# Patient Record
Sex: Male | Born: 2000
Health system: Southern US, Community
[De-identification: ages and names within clinical notes are randomized; demographics above are authoritative.]

## PROBLEM LIST (undated history)

## (undated) DIAGNOSIS — K5190 Ulcerative colitis, unspecified, without complications: Secondary | ICD-10-CM

## (undated) MED FILL — Medication: Fill #1 | Status: CN

---
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
## 2020-01-01 DIAGNOSIS — Z114 Encounter for screening for human immunodeficiency virus [HIV]: Secondary | ICD-10-CM | POA: Diagnosis not present

## 2020-01-01 DIAGNOSIS — Z113 Encounter for screening for infections with a predominantly sexual mode of transmission: Secondary | ICD-10-CM | POA: Diagnosis not present

## 2020-01-11 MED FILL — HUMIRA PEN 40 MG/0.8ML PNKT: 40 | 28 days supply | Qty: 2 | Fill #2

## 2020-02-09 ENCOUNTER — Encounter (INDEPENDENT_AMBULATORY_CARE_PROVIDER_SITE_OTHER): Payer: Self-pay | Admitting: Student in an Organized Health Care Education/Training Program

## 2020-02-11 ENCOUNTER — Other Ambulatory Visit (HOSPITAL_COMMUNITY): Payer: Self-pay | Admitting: Internal Medicine

## 2020-02-11 ENCOUNTER — Other Ambulatory Visit: Payer: Self-pay | Admitting: Pharmacist

## 2020-02-11 MED ORDER — HUMIRA PEN 40 MG/0.8ML ~~LOC~~ PNKT: PEN_INJECTOR | SUBCUTANEOUS | 2 refills | Status: DC

## 2020-02-12 MED FILL — HUMIRA PEN 40 MG/0.8ML PNKT: 40 | 28 days supply | Qty: 2 | Fill #0

## 2020-02-16 DIAGNOSIS — K5100 Ulcerative (chronic) pancolitis without complications: Secondary | ICD-10-CM | POA: Diagnosis not present

## 2020-02-29 ENCOUNTER — Telehealth (INDEPENDENT_AMBULATORY_CARE_PROVIDER_SITE_OTHER): Payer: Self-pay

## 2020-02-29 ENCOUNTER — Telehealth (INDEPENDENT_AMBULATORY_CARE_PROVIDER_SITE_OTHER): Payer: 59 | Admitting: Pediatric Gastroenterology

## 2020-02-29 ENCOUNTER — Encounter (INDEPENDENT_AMBULATORY_CARE_PROVIDER_SITE_OTHER): Payer: Self-pay

## 2020-02-29 NOTE — Telephone Encounter (Signed)
Called in regards to video visit. Patient had not logged on by scheduled time. Went straight to [employer]. Left message to call the office back.

## 2020-02-29 NOTE — Progress Notes (Deleted)
This is a Pediatric Specialist E-Visit follow up consult provided via [place] and their parent/guardian Theodore Hopkins (name of consenting adult) consented to an E-Visit consult today.  Location of patient: [name] is at his home (location) Location of provider: Harold Hopkins is at his office (location) Patient was referred by Theodore Fam, MD   The following participants were involved in this E-Visit: The patient, his mother and me (list of participants and their roles)  Chief Complain/ Reason for E-Visit today: Ulcerative colitis follow-up Total time on call: 30 Follow up: 1 month     Pediatric Gastroenterology Follow Up Visit   REFERRING PROVIDER:  Monna Hopkins, [address] [place],  [place] [id number]   ASSESSMENT:     I had the pleasure of seeing Theodore Hopkins, 19 y.o. 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 Since his last visit, we changed his treatment to adalimumab (Humira) due to active disease. An upper endoscopy in June 2021 was grossly normal. Colonoscopy showed moderate inflammation in the left colon when he was on mesalamine.  Quantiferon Gold was negative in June 2021; HBsAb was non-reactive  Theodore Hopkins avoiding lactose-containing products, or he takes them with Lactaid supplements to decrease his symptoms of lactose intolerance.   Nutrition is good.  I have no concerns about mental health at this time.  The goal of therapy is to obtain mucosal healing to try to decrease the risk of long-term complications such as colorectal cancer.      PLAN:       *** Thank you for allowing [place] to participate in the care of your patient      HISTORY OF PRESENT ILLNESS: Theodore Hopkins is a 19 y.o. male (DOB: 08-10-2000) who is seen in follow up for evaluation of ulcerative colitis, treated with Humira. History was obtained from both Theodore Hopkins and his mother. Overall, *** is doing ***.  Stools are *** per day. The stools are *** consistency. There is *** in the stool. There is *** abdominal pain. There is *** vomiting. There is *** nausea. Energy level is ***. Appetite is ***. Weight is ***. ***  has no signs of extraintestinal manifestations of active IBD, including dysphagia, fever, arthralgia, arthritis, back pain, jaundice, pruritus, erythema nodosum, eye redness, eye pain, shortness of breath, or oral ulceration. Last menstrual period was ***.   PAST MEDICAL HISTORY: Past Medical History:  Diagnosis Date  . Ulcerative colitis ([place])    Immunization History  Administered Date(s) Administered  . Influenza,inj,Quad PF,6+ Mos 12/20/2013, 12/24/2014, 01/19/2017, 01/22/2018  . Influenza-Unspecified 12/04/2015   PAST SURGICAL HISTORY: No past surgical history on file. SOCIAL HISTORY: Social History   Socioeconomic History  . Marital status: Single    Spouse name: Not on file  . Number of children: Not on file  . Years of education: Not on file  . Highest education level: Not on file  Occupational History  . Not on file  Tobacco Use  . Smoking status: Never Smoker  . Smokeless tobacco: Never Used  Substance and Sexual Activity  . Alcohol use: Not on file  . Drug use: Not on file  . Sexual activity: Not on file  Other Topics Concern  . Not on file  Social History Narrative   12th grade at [employer] with mom, dad   Social Determinants of Health   Financial Resource Strain: Not on file  Food Insecurity: Not  on file  Transportation Needs: Not on file  Physical Activity: Not on file  Stress: Not on file  Social Connections: Not on file   FAMILY HISTORY: family history includes Diabetes in his father and paternal grandfather.   REVIEW OF SYSTEMS:  The balance of 12 systems reviewed is negative except as noted in the HPI.  MEDICATIONS: Current Outpatient Medications  Medication Sig Dispense Refill  . Adalimumab (HUMIRA PEN) 40 MG/0.8ML  PNKT Inject 0.8 ml (40 mg total) under the skin every 14 days. 2 each 2  . FLUCELVAX QUADRIVALENT 0.5 ML injection      No current facility-administered medications for this visit.   ALLERGIES: Cashew nut oil, Other, and Apriso [mesalamine er]  VITAL SIGNS: There were no vitals taken for this visit. PHYSICAL EXAM: Looked well on video exam  DIAGNOSTIC STUDIES:  I have reviewed all pertinent diagnostic studies, including:  No results found for this or any previous visit (from the past 2160 hour(s)).   Samyukta Cura A. Theodore Savannah, MD Chief, Division of Pediatric Gastroenterology Professor of Pediatrics

## 2020-03-01 ENCOUNTER — Ambulatory Visit: Payer: 59 | Attending: Internal Medicine

## 2020-03-01 ENCOUNTER — Other Ambulatory Visit (HOSPITAL_BASED_OUTPATIENT_CLINIC_OR_DEPARTMENT_OTHER): Payer: Self-pay | Admitting: Internal Medicine

## 2020-03-01 DIAGNOSIS — Z23 Encounter for immunization: Secondary | ICD-10-CM

## 2020-03-01 NOTE — Progress Notes (Signed)
   Covid-19 Vaccination Clinic  Name:  Theodore Hopkins    MRN: [id number] DOB: [birth year]  03/01/2020  Mr. Flenner was observed post Covid-19 immunization for 15 minutes without incident. He was provided with Vaccine Information Sheet and instruction to access the V-Safe system.  Vaccinated by [facility] Brunette  Mr. Robbins was instructed to call 911 with any severe reactions post vaccine: Marland Kitchen Difficulty breathing  . Swelling of face and throat  . A fast heartbeat  . A bad rash all over body  . Dizziness and weakness   Immunizations Administered    Name Date Dose VIS Date Route   Moderna Covid-19 Booster Vaccine 03/01/2020  2:30 PM 0.25 mL 12/23/2019 Intramuscular   Manufacturer: Levan Hurst   Lot: [id number]   [place]: [id number]

## 2020-03-02 MED FILL — PFIZER-BIONTECH COVID-19 VA: 30 | 21 days supply | Qty: 0 | Fill #0

## 2020-03-08 DIAGNOSIS — H5213 Myopia, bilateral: Secondary | ICD-10-CM | POA: Diagnosis not present

## 2020-03-30 DIAGNOSIS — K5130 Ulcerative (chronic) rectosigmoiditis without complications: Secondary | ICD-10-CM | POA: Diagnosis not present

## 2020-04-19 ENCOUNTER — Other Ambulatory Visit (HOSPITAL_COMMUNITY): Payer: Self-pay | Admitting: Pediatric Gastroenterology

## 2020-04-20 ENCOUNTER — Other Ambulatory Visit: Payer: Self-pay | Admitting: Pharmacist

## 2020-04-20 ENCOUNTER — Other Ambulatory Visit (HOSPITAL_COMMUNITY): Payer: Self-pay | Admitting: Internal Medicine

## 2020-04-20 MED ORDER — HUMIRA PEN 40 MG/0.8ML ~~LOC~~ PNKT: PEN_INJECTOR | SUBCUTANEOUS | 2 refills | Status: DC

## 2020-04-20 MED ORDER — HUMIRA PEN 40 MG/0.8ML ~~LOC~~ PNKT: PEN_INJECTOR | SUBCUTANEOUS | 3 refills | Status: DC

## 2020-04-20 MED FILL — HUMIRA PEN 40 MG/0.8ML PNKT: 40 | 28 days supply | Qty: 2 | Fill #0

## 2020-05-27 ENCOUNTER — Other Ambulatory Visit (HOSPITAL_COMMUNITY): Payer: Self-pay

## 2020-06-09 ENCOUNTER — Other Ambulatory Visit (HOSPITAL_COMMUNITY): Payer: Self-pay

## 2020-06-09 MED FILL — Adalimumab Auto-injector Kit 40 MG/0.8ML: SUBCUTANEOUS | 28 days supply | Qty: 2 | Fill #0 | Status: AC

## 2020-06-27 ENCOUNTER — Other Ambulatory Visit (HOSPITAL_COMMUNITY): Payer: Self-pay

## 2020-06-29 ENCOUNTER — Other Ambulatory Visit (HOSPITAL_COMMUNITY): Payer: Self-pay

## 2020-07-14 ENCOUNTER — Encounter (HOSPITAL_BASED_OUTPATIENT_CLINIC_OR_DEPARTMENT_OTHER): Payer: Self-pay

## 2020-07-14 ENCOUNTER — Emergency Department (HOSPITAL_BASED_OUTPATIENT_CLINIC_OR_DEPARTMENT_OTHER)
Admission: EM | Admit: 2020-07-14 | Discharge: 2020-07-14 | Disposition: A | Discharge status: Home / Self Care | Payer: 59 | Attending: Emergency Medicine | Admitting: Emergency Medicine

## 2020-07-14 ENCOUNTER — Other Ambulatory Visit: Payer: Self-pay

## 2020-07-14 DIAGNOSIS — L0591 Pilonidal cyst without abscess: Secondary | ICD-10-CM | POA: Diagnosis not present

## 2020-07-14 DIAGNOSIS — L0501 Pilonidal cyst with abscess: Secondary | ICD-10-CM | POA: Diagnosis not present

## 2020-07-14 MED ORDER — LIDOCAINE-EPINEPHRINE (PF) 2 %-1:200000 IJ SOLN
10.0000 mL | Freq: Once | INTRAMUSCULAR | Status: AC
  Administered 2020-07-14: 10 mL
  Filled 2020-07-14: qty 20

## 2020-07-14 MED ORDER — SULFAMETHOXAZOLE-TRIMETHOPRIM 800-160 MG PO TABS
1.0000 | ORAL_TABLET | Freq: Once | ORAL | Status: AC
  Administered 2020-07-14: 1 via ORAL
  Filled 2020-07-14: qty 1

## 2020-07-14 MED ORDER — SULFAMETHOXAZOLE-TRIMETHOPRIM 800-160 MG PO TABS
1.0000 | ORAL_TABLET | Freq: Two times a day (BID) | ORAL | 0 refills | Status: AC
  Filled 2020-07-14: qty 10, 5d supply, fill #0

## 2020-07-14 NOTE — Discharge Instructions (Signed)
Please read and follow all provided instructions.  Your diagnoses today include:  1. Pilonidal cyst     Tests performed today include:  Vital signs. See below for your results today.   Medications prescribed:   Bactrim (trimethoprim/sulfamethoxazole) - antibiotic  You have been prescribed an antibiotic medicine: take the entire course of medicine even if you are feeling better. Stopping early can cause the antibiotic not to work.  Take any prescribed medications only as directed.   Home care instructions:   Follow any educational materials contained in this packet  Follow-up instructions: Return to the Emergency Department in 48 hours for a recheck if your symptoms are not significantly improved.  Return instructions:  Return to the Emergency Department if you have:  Fever  Worsening symptoms  Worsening pain  Worsening swelling  Redness of the skin that moves away from the affected area, especially if it streaks away from the affected area   Any other emergent concerns  Your vital signs today were: BP (!) 151/98 (BP Location: Right Arm)   Pulse (!) 103   Temp 98.3 F (36.8 C) (Oral)   Resp 18   Ht 6' 1"  (1.854 m)   Wt (!) 221 kg   SpO2 97%   BMI 64.28 kg/m  If your blood pressure (BP) was elevated above 135/85 this visit, please have this repeated by your doctor within one month. --------------

## 2020-07-14 NOTE — ED Triage Notes (Signed)
Patient states he has pilonidal cyst at the tail bone.

## 2020-07-14 NOTE — ED Provider Notes (Signed)
[facility] Provider Note   CSN: [id number] Arrival date & time: 07/14/20  2128     History Chief Complaint  Patient presents with  . Cyst    Theodore Hopkins is a 20 y.o. male.  Patient presents emergency department today for evaluation of pilonidal cyst.  He has had swelling and pain in the area at the top of his gluteal cleft, worsening, over the past 2 days.  Pain is worse with pressure.  No fevers, nausea or vomiting.  He has a history of these in the past that remained small and resolved spontaneously.  He has not had an I&D before.  No rectal pain.         Past Medical History:  Diagnosis Date  . Ulcerative colitis [facility])     Patient Active Problem List   Diagnosis Date Noted  . Ulcerative colitis ([place]) 08/26/2017    History reviewed. No pertinent surgical history.     Family History  Problem Relation Age of Onset  . Diabetes Father   . Diabetes Paternal Grandfather   . Colitis Neg Hx   . Irritable bowel syndrome Neg Hx   . Crohn's disease Neg Hx   . Celiac disease Neg Hx     Social History   Tobacco Use  . Smoking status: Never Smoker  . Smokeless tobacco: Never Used  Substance Use Topics  . Alcohol use: Never  . Drug use: Never    Home Medications Prior to Admission medications   Medication Sig Start Date End Date Taking? Authorizing Provider  Adalimumab (HUMIRA PEN) 40 MG/0.8ML PNKT Inject 0.8 ml (40 mg total) under the skin every 14 days. 04/20/20  Yes Jegede, Olugbemiga E, MD  Adalimumab 40 MG/0.8ML PNKT INJECT 0.8 ML (40 MG TOTAL) UNDER THE SKIN EVERY 14 DAYS. 04/20/20 04/20/21 Yes Jegede, Olugbemiga E, MD  Adalimumab 40 MG/0.8ML PNKT INJECT 0.8 ML (40 MG TOTAL) UNDER THE SKIN EVERY FOURTEEN (14) DAYS. 04/19/20 04/19/21 Yes Kandis Ban, MD  Adalimumab 40 MG/0.8ML PNKT INJECT 0.8 ML (40 MG TOTAL) UNDER THE SKIN EVERY 14 DAYS. 02/11/20 02/10/21 Yes Jegede, Olugbemiga E, MD  Adalimumab 40 MG/0.8ML  PNKT INJECT 40 MG INTO THE SKIN EVERY 14 (FOURTEEN) DAYS. 10/21/19 10/20/20 Yes Jegede, Marlena Clipper, MD  loratadine (CLARITIN) 10 MG tablet Take by mouth.   Yes [provider]  sulfamethoxazole-trimethoprim (BACTRIM DS) 800-160 MG tablet Take 1 tablet by mouth 2 (two) times daily for 5 days. 07/14/20 07/19/20 Yes Carlisle Cater, PA-C  COVID-19 mRNA vaccine, Pfizer, 30 MCG/0.3ML injection INJECT AS DIRECTED 03/01/20 03/01/21  Carlyle Basques, MD  FLUCELVAX QUADRIVALENT 0.5 ML injection  01/24/19   [provider]    Allergies    Cashew nut (anacardium occidentale) skin test, Cashew nut oil, Other, and Apriso [mesalamine er]  Review of Systems   Review of Systems  Constitutional: Negative for fever.  Gastrointestinal: Negative for nausea and vomiting.  Skin: Negative for color change.       Positive for abscess  Hematological: Negative for adenopathy.    Physical Exam Updated Vital Signs BP (!) 151/98 (BP Location: Right Arm)   Pulse (!) 103   Temp 98.3 F (36.8 C) (Oral)   Resp 18   Ht 6' 1"  (1.854 m)   Wt (!) 221 kg   SpO2 97%   BMI 64.28 kg/m   Physical Exam Vitals and nursing note reviewed.  Constitutional:      Appearance: He is well-developed.  HENT:  Head: Normocephalic and atraumatic.  Eyes:     Conjunctiva/sclera: Conjunctivae normal.  Pulmonary:     Effort: No respiratory distress.  Genitourinary:    Comments: Patient with 2 cm area of induration and fluctuance at the superior aspect of the gluteal cleft, moderately tender, with overlying erythema without extensive cellulitis. Musculoskeletal:     Cervical back: Normal range of motion and neck supple.  Skin:    General: Skin is warm and dry.  Neurological:     Mental Status: He is alert.     ED Results / Procedures / Treatments   Labs (all labs ordered are listed, but only abnormal results are displayed) Labs Reviewed - No data to display  EKG None  Radiology No results  found.  Procedures Procedures   Medications Ordered in ED Medications  lidocaine-EPINEPHrine (XYLOCAINE W/EPI) 2 %-1:200000 (PF) injection 10 mL (10 mLs Infiltration Given 07/14/20 2223)  sulfamethoxazole-trimethoprim (BACTRIM DS) 800-160 MG per tablet 1 tablet (1 tablet Oral Given 07/14/20 2228)    ED Course  I have reviewed the triage vital signs and the nursing notes.  Pertinent labs & imaging results that were available during my care of the patient were reviewed by me and considered in my medical decision making (see chart for details).  Patient seen and examined.  Patient evaluated with RN chaperone.  Exam is consistent with pilonidal cyst.  Discussed risks and benefits of incision and drainage with parent at bedside.  Plan to proceed with I&D.  No allergies.  Vital signs reviewed and are as follows: BP (!) 151/98 (BP Location: Right Arm)   Pulse (!) 103   Temp 98.3 F (36.8 C) (Oral)   Resp 18   Ht 6' 1"  (1.854 m)   Wt 100.2 kg   SpO2 97%   BMI 29.16 kg/m   10:39 PM I&D performed without any complications.  The patient was urged to return to the Emergency Department urgently with worsening pain, swelling, expanding erythema especially if it streaks away from the affected area, fever, or if they have any other concerns.   The patient was urged to return to the Emergency Department or go to their PCP in 48 hours for wound recheck if the area is not significantly improved.  Encouraged warm soaks in the interim.  The patient verbalized understanding and stated agreement with this plan.     MDM Rules/Calculators/A&P                          Patient with pilonidal cyst, incision and drainage as above without any complications.  No perirectal or perianal abscess.  No systemic symptoms of illness.  Patient appears well, nontoxic.   Final Clinical Impression(s) / ED Diagnoses Final diagnoses:  Pilonidal cyst    Rx / DC Orders ED Discharge Orders         Ordered     sulfamethoxazole-trimethoprim (BACTRIM DS) 800-160 MG tablet  2 times daily        07/14/20 2226           Carlisle Cater, PA-C 07/14/20 2242    Drenda Freeze, MD 07/14/20 [phone]

## 2020-07-15 ENCOUNTER — Other Ambulatory Visit (HOSPITAL_BASED_OUTPATIENT_CLINIC_OR_DEPARTMENT_OTHER): Payer: Self-pay

## 2020-07-21 ENCOUNTER — Other Ambulatory Visit (HOSPITAL_COMMUNITY): Payer: Self-pay

## 2020-07-25 ENCOUNTER — Other Ambulatory Visit (HOSPITAL_COMMUNITY): Payer: Self-pay

## 2020-07-25 MED FILL — Adalimumab Auto-injector Kit 40 MG/0.8ML: SUBCUTANEOUS | 28 days supply | Qty: 2 | Fill #0 | Status: AC

## 2020-08-03 ENCOUNTER — Other Ambulatory Visit (HOSPITAL_COMMUNITY): Payer: Self-pay

## 2020-08-30 ENCOUNTER — Other Ambulatory Visit (HOSPITAL_COMMUNITY): Payer: Self-pay

## 2020-09-02 ENCOUNTER — Other Ambulatory Visit (HOSPITAL_COMMUNITY): Payer: Self-pay

## 2020-09-07 ENCOUNTER — Other Ambulatory Visit (HOSPITAL_COMMUNITY): Payer: Self-pay

## 2020-09-07 MED FILL — Adalimumab Auto-injector Kit 40 MG/0.8ML: SUBCUTANEOUS | 28 days supply | Qty: 2 | Fill #1 | Status: AC

## 2020-09-14 ENCOUNTER — Other Ambulatory Visit (HOSPITAL_COMMUNITY): Payer: Self-pay

## 2020-09-15 ENCOUNTER — Other Ambulatory Visit (HOSPITAL_COMMUNITY): Payer: Self-pay

## 2020-09-15 MED ORDER — CARESTART COVID-19 HOME TEST VI KIT
PACK | 0 refills | Status: DC
  Filled 2020-09-15: qty 4, 4d supply, fill #0

## 2020-09-16 ENCOUNTER — Other Ambulatory Visit (HOSPITAL_COMMUNITY): Payer: Self-pay

## 2020-09-27 ENCOUNTER — Telehealth: Payer: Self-pay | Admitting: Pharmacist

## 2020-09-27 NOTE — Telephone Encounter (Signed)
Called patient to schedule an appointment for the [employer] [facility]. I was unable to reach the patient so I left a HIPAA-compliant message requesting that the patient return my call.   Luke Van Ausdall, PharmD, BCACP, CPP Clinical Pharmacist [facility] [phone]  

## 2020-09-28 ENCOUNTER — Other Ambulatory Visit: Payer: Self-pay

## 2020-09-28 ENCOUNTER — Ambulatory Visit: Payer: 59 | Attending: Family Medicine | Admitting: Pharmacist

## 2020-09-28 DIAGNOSIS — Z79899 Other long term (current) drug therapy: Secondary | ICD-10-CM

## 2020-09-28 NOTE — Progress Notes (Signed)
  S: Patient presents for review of their specialty medication therapy.  Patient is currently taking Humira for UC. Patient is managed by Dr. Yehuda Savannah for this.   Adherence: confirmed  Efficacy: confirmed  Dosing:  Ulcerative colitis:  Maintenance: 40 mg every other week  Dose adjustments: Renal: no dose adjustments (has not been studied) Hepatic: no dose adjustments (has not been studied)  Drug-drug interactions: none identified   Screening: TB test: completed, negative 08/13/2019 Hepatitis: completed; HBsAb negative   Monitoring: S/sx of infection: none  CBC: monitored by [facility]; see Care Everywhere  S/sx of hypersensitivity: none  S/sx of malignancy: none  S/sx of heart failure: none   Other side effects: none   O:     Lab Results  Component Value Date   WBC 5.2 10/09/2018   HGB 14.3 10/09/2018   HCT 43.7 10/09/2018   MCV 80.0 10/09/2018   PLT 303 10/09/2018      Chemistry      Component Value Date/Time   NA 141 10/09/2018 1544   K 4.3 10/09/2018 1544   CL 102 10/09/2018 1544   CO2 31 10/09/2018 1544   BUN 9 10/09/2018 1544   CREATININE 0.99 10/09/2018 1544      Component Value Date/Time   CALCIUM 10.1 10/09/2018 1544   ALKPHOS 179 (H) 07/30/2017 2028   AST 18 10/09/2018 1544   ALT 10 10/09/2018 1544   BILITOT 0.4 10/09/2018 1544       A/P: 1. Medication review: Patient currently taking Humira for [place]. Reviewed the medication with the patient, including the following: Humira is a TNF blocking agent indicated for ankylosing spondylitis, Crohn's disease, Hidradenitis suppurativa, psoriatic arthritis, plaque psoriasis, ulcerative colitis, and uveitis. Patient educated on purpose, proper use and potential adverse effects of Humira. Possible adverse effects are increased risk of infections, headache, and injection site reactions. There is the possibility of an increased risk of malignancy but it is not well understood if this increased risk is due to there  medication or the disease state. There are rare cases of pancytopenia and aplastic anemia. For SubQ injection at separate sites in the thigh or lower abdomen (avoiding areas within 2 inches of navel); rotate injection sites. May leave at room temperature for ~15 to 30 minutes prior to use; do not remove cap or cover while allowing product to reach room temperature. Do not use if solution is discolored or contains particulate matter. Do not administer to skin which is red, tender, bruised, hard, or that has scars, stretch marks, or psoriasis plaques. Needle cap of the prefilled syringe or needle cover for the adalimumab pen may contain latex. Prefilled pens and syringes are available for use by patients and the full amount of the syringe should be injected (self-administration); the vial is intended for institutional use only. Vials do not contain a preservative; discard unused portion. No recommendations for any changes at this time.   Benard Halsted, PharmD, [place], [place] [phone]

## 2020-09-29 ENCOUNTER — Other Ambulatory Visit (HOSPITAL_COMMUNITY): Payer: Self-pay

## 2020-09-29 MED FILL — Adalimumab Auto-injector Kit 40 MG/0.8ML: SUBCUTANEOUS | 28 days supply | Qty: 2 | Fill #1 | Status: CN

## 2020-10-05 ENCOUNTER — Other Ambulatory Visit (HOSPITAL_COMMUNITY): Payer: Self-pay

## 2020-10-05 MED FILL — Adalimumab Auto-injector Kit 40 MG/0.8ML: SUBCUTANEOUS | 28 days supply | Qty: 2 | Fill #1 | Status: AC

## 2020-10-19 ENCOUNTER — Other Ambulatory Visit (INDEPENDENT_AMBULATORY_CARE_PROVIDER_SITE_OTHER): Payer: Self-pay | Admitting: Pediatric Gastroenterology

## 2020-10-19 ENCOUNTER — Other Ambulatory Visit: Payer: Self-pay | Admitting: Pharmacist

## 2020-10-19 ENCOUNTER — Other Ambulatory Visit (HOSPITAL_COMMUNITY): Payer: Self-pay

## 2020-10-19 MED ORDER — HUMIRA PEN 40 MG/0.8ML ~~LOC~~ PNKT
PEN_INJECTOR | SUBCUTANEOUS | 3 refills | Status: DC
  Filled 2020-10-19: qty 2, fill #0

## 2020-10-19 MED ORDER — HUMIRA PEN 40 MG/0.8ML ~~LOC~~ PNKT
PEN_INJECTOR | SUBCUTANEOUS | 3 refills | Status: DC
Start: 2020-10-19 — End: 2021-02-20
  Filled 2020-10-19: qty 2, fill #0
  Filled 2020-10-27: qty 2, 28d supply, fill #0
  Filled 2020-11-23: qty 2, 28d supply, fill #1
  Filled 2020-12-30: qty 2, 28d supply, fill #2
  Filled 2021-01-23: qty 2, 28d supply, fill #3

## 2020-10-19 MED ORDER — CARESTART COVID-19 HOME TEST VI KIT
PACK | 0 refills | Status: DC
  Filled 2020-10-19: qty 2, 2d supply, fill #0

## 2020-10-27 ENCOUNTER — Other Ambulatory Visit (HOSPITAL_COMMUNITY): Payer: Self-pay

## 2020-11-21 ENCOUNTER — Other Ambulatory Visit (HOSPITAL_COMMUNITY): Payer: Self-pay

## 2020-11-21 DIAGNOSIS — N485 Ulcer of penis: Secondary | ICD-10-CM | POA: Diagnosis not present

## 2020-11-21 DIAGNOSIS — L739 Follicular disorder, unspecified: Secondary | ICD-10-CM | POA: Diagnosis not present

## 2020-11-21 DIAGNOSIS — Z113 Encounter for screening for infections with a predominantly sexual mode of transmission: Secondary | ICD-10-CM | POA: Diagnosis not present

## 2020-11-23 ENCOUNTER — Other Ambulatory Visit (HOSPITAL_COMMUNITY): Payer: Self-pay

## 2020-11-28 ENCOUNTER — Other Ambulatory Visit (HOSPITAL_COMMUNITY): Payer: Self-pay

## 2020-11-29 ENCOUNTER — Other Ambulatory Visit (HOSPITAL_COMMUNITY): Payer: Self-pay

## 2020-12-19 ENCOUNTER — Telehealth (INDEPENDENT_AMBULATORY_CARE_PROVIDER_SITE_OTHER): Payer: 59 | Admitting: Pediatric Gastroenterology

## 2020-12-23 ENCOUNTER — Other Ambulatory Visit (HOSPITAL_COMMUNITY): Payer: Self-pay

## 2020-12-30 ENCOUNTER — Other Ambulatory Visit (HOSPITAL_COMMUNITY): Payer: Self-pay

## 2021-01-02 ENCOUNTER — Other Ambulatory Visit (HOSPITAL_COMMUNITY): Payer: Self-pay

## 2021-01-17 DIAGNOSIS — J0110 Acute frontal sinusitis, unspecified: Secondary | ICD-10-CM | POA: Diagnosis not present

## 2021-01-17 DIAGNOSIS — B301 Conjunctivitis due to adenovirus: Secondary | ICD-10-CM | POA: Diagnosis not present

## 2021-01-23 ENCOUNTER — Other Ambulatory Visit (HOSPITAL_COMMUNITY): Payer: Self-pay

## 2021-01-30 ENCOUNTER — Other Ambulatory Visit (HOSPITAL_COMMUNITY): Payer: Self-pay

## 2021-01-30 ENCOUNTER — Telehealth (INDEPENDENT_AMBULATORY_CARE_PROVIDER_SITE_OTHER): Payer: 59 | Admitting: Pediatric Gastroenterology

## 2021-01-30 NOTE — Progress Notes (Deleted)
This is a Pediatric Specialist E-Visit follow up consult provided via [place] and their parent/guardian Mackay Hanauer (name of consenting adult) consented to an E-Visit consult today.  Location of patient: [name] is at his home (location) Location of provider: Harold Hedge is at his home office (location) Patient was referred by Monna Fam, MD   The following participants were involved in this E-Visit: The patient, his mother and me (list of participants and their roles)  Chief Complain/ Reason for E-Visit today: Ulcerative colitis follow-up Total time on call: 18 minutes, with an additional 10 minutes for documentation Follow up: ***      Pediatric Gastroenterology Follow Up Visit   REFERRING PROVIDER:  Monna Fam, [address] [place],  [place] [address]   ASSESSMENT:     I had the pleasure of seeing Theodore Hopkins, 20 y.o. male (DOB: Sep 27, 2000) who I saw in follow up today for evaluation of ulcerative colitis (diagnosed 08/31/2015 at [facility]), which is limited the sigmoid and rectum colon. He had endoscopic re-evaluation in June 2021 at [facility], which showed stable disease extension. My impression is that Emonte's ulcerative colitis is in remission. [name] is on Humira, last dose on 03/19/20. Humira 3.3 mcg/mL (02/16/2020) - he did not do weekly injections as suggested but he is doing well on biweekly injections, so we will continue to monitor.  He has a history of sensitivity to Apriso (mesalamine). When he took mesalamine he had severe abdominal pain.   Quantiferon Gold negative (08/13/19) HBsAb Negative  PLAN:  Humira 40 mg every 2 weeks CBC, CMP, ESR, CRP, Humira level, Quantiferon Gold Next appointment in 4 months Thank you for allowing [place] to participate in the care of your patient      Thank you for allowing [place] to participate in the care of your patient      HISTORY OF PRESENT ILLNESS: Theodore Hopkins is a 20 y.o. male (DOB:  [birth year]) who is seen in consultation for evaluation of ulcerative colitis. History was obtained from both Jasmon and his mother. Overall, *** is doing ***. Stools are *** per day. The stools are *** consistency. There is *** in the stool. There is *** abdominal pain. There is *** vomiting. There is *** nausea. Energy level is ***. Appetite is ***. Weight is ***. ***  has no signs of extraintestinal manifestations of active IBD, including dysphagia, fever, arthralgia, arthritis, back pain, jaundice, pruritus, erythema nodosum, eye redness, eye pain, shortness of breath, or oral ulceration. Last menstrual period was ***.    08/31/2015 Normal TPMT activity (08/31/2015) Negative Quantiferon Gold HBsAb non rective  Chronic superficial gastritis  The sigmoid and rectal biopsies demonstrate evidence of active cryptitis, expansion of lamina propria by a lymphoplasmacytic infiltrate, focal paneth cell metaplasia and mild architectural distortion in the form of atrophy and crypt disarray. Overall the histologic findings are most concerning for idiopathic inflammatory bowel disease, specifically ulcerative colitis.  06/06/16 D. Right/ascending colon, endoscopic biopsy: Chronic active colitis. No granuloma identified.   E. Cecum, endoscopic biopsy: Chronic active colitis. No granuloma identified.   F. Terminal ileum, endoscopic biopsy: Enteric mucosa with no specific pathologic diagnosis.   G. Left/descending colon, endoscopic biopsy: Colonic mucosa with no specific pathologic diagnosis. No acute colitis,chronic mucosal injury or granuloma identified.  H. Sigmoid colon, endoscopic biopsy: Colonic mucosa with no specific pathologic diagnosis. No acute colitis,chronic mucosal injury or granuloma identified.  I. Rectum, endoscopic biopsy:  Colo-rectal mucosa with crypt architecture disarray. No acute  colitis or granuloma   PAST MEDICAL HISTORY: Past Medical History:  Diagnosis Date    Ulcerative colitis ([place])    Immunization History  Administered Date(s) Administered   Influenza,inj,Quad PF,6+ Mos 12/20/2013, 12/24/2014, 01/19/2017, 01/22/2018   Influenza-Unspecified 12/04/2015   Moderna SARS-COV2 Booster Vaccination 03/01/2020   PAST SURGICAL HISTORY: No past surgical history on file. SOCIAL HISTORY: Social History   Socioeconomic History   Marital status: Single    Spouse name: Not on file   Number of children: Not on file   Years of education: Not on file   Highest education level: Not on file  Occupational History   Not on file  Tobacco Use   Smoking status: Never   Smokeless tobacco: Never  Substance and Sexual Activity   Alcohol use: Never   Drug use: Never   Sexual activity: Never  Other Topics Concern   Not on file  Social History Narrative   12th grade at [employer] with mom, dad   Social Determinants of Health   Financial Resource Strain: Not on file  Food Insecurity: Not on file  Transportation Needs: Not on file  Physical Activity: Not on file  Stress: Not on file  Social Connections: Not on file   FAMILY HISTORY: family history includes Diabetes in his father and paternal grandfather.   REVIEW OF SYSTEMS:  The balance of 12 systems reviewed is negative except as noted in the HPI.  MEDICATIONS: Current Outpatient Medications  Medication Sig Dispense Refill   Adalimumab (HUMIRA PEN) 40 MG/0.8ML PNKT INJECT 0.8 ML (40 MG TOTAL) UNDER THE SKIN EVERY 14 DAYS. 2 each 3   COVID-19 At Home Antigen Test (CARESTART COVID-19 HOME TEST) KIT Use as directed. 2 each 0   COVID-19 mRNA vaccine, Pfizer, 30 MCG/0.3ML injection INJECT AS DIRECTED .3 mL 0   FLUCELVAX QUADRIVALENT 0.5 ML injection      loratadine (CLARITIN) 10 MG tablet Take by mouth.     No current facility-administered medications for this visit.   ALLERGIES: Cashew nut (anacardium occidentale) skin test, Cashew nut oil, Other, and Apriso [mesalamine er]   VITAL SIGNS: There were no vitals taken for this visit. PHYSICAL EXAM: Looked well on video exam  DIAGNOSTIC STUDIES:  I have reviewed all pertinent diagnostic studies, including:  No results found for this or any previous visit (from the past 2160 hour(s)).   Lucky Trotta A. Yehuda Savannah, MD Chief, Division of Pediatric Gastroenterology Professor of Pediatrics

## 2021-01-31 ENCOUNTER — Telehealth (INDEPENDENT_AMBULATORY_CARE_PROVIDER_SITE_OTHER): Payer: Self-pay | Admitting: Pediatric Gastroenterology

## 2021-01-31 NOTE — Telephone Encounter (Signed)
  Who's calling (name and relationship to patient) :Joelene Millin  Mother   Best contact [phone]  Provider they see: Dr. Yehuda Savannah   Reason for call: Patient could not get on the link. Mom get message after 5. Mother wanted to know what will be done  to get appointment     [place]  Name of prescription:  Pharmacy:

## 2021-02-01 NOTE — Telephone Encounter (Signed)
DPR was obtained by front office. Patient was rescheduled for 02/20/21

## 2021-02-20 ENCOUNTER — Other Ambulatory Visit (HOSPITAL_COMMUNITY): Payer: Self-pay

## 2021-02-20 ENCOUNTER — Telehealth (INDEPENDENT_AMBULATORY_CARE_PROVIDER_SITE_OTHER): Payer: 59 | Admitting: Pediatric Gastroenterology

## 2021-02-20 ENCOUNTER — Other Ambulatory Visit: Payer: Self-pay

## 2021-02-20 ENCOUNTER — Encounter (INDEPENDENT_AMBULATORY_CARE_PROVIDER_SITE_OTHER): Payer: Self-pay | Admitting: Pediatric Gastroenterology

## 2021-02-20 ENCOUNTER — Other Ambulatory Visit: Payer: Self-pay | Admitting: Pharmacist

## 2021-02-20 VITALS — Ht 73.0 in | Wt 254.0 lb

## 2021-02-20 DIAGNOSIS — R1013 Epigastric pain: Secondary | ICD-10-CM

## 2021-02-20 DIAGNOSIS — K5130 Ulcerative (chronic) rectosigmoiditis without complications: Secondary | ICD-10-CM | POA: Diagnosis not present

## 2021-02-20 MED ORDER — HUMIRA PEN 40 MG/0.8ML ~~LOC~~ PNKT
40.0000 mg | PEN_INJECTOR | SUBCUTANEOUS | 5 refills | Status: DC
  Filled 2021-02-20: qty 2, fill #0
  Filled 2021-02-21: qty 2, 28d supply, fill #0
  Filled 2021-05-01 – 2021-06-02 (×3): qty 2, 28d supply, fill #1
  Filled 2021-06-27: qty 2, 28d supply, fill #2
  Filled 2021-07-25: qty 2, 28d supply, fill #3
  Filled 2021-09-01: qty 2, 28d supply, fill #4
  Filled 2021-10-10: qty 2, 28d supply, fill #5

## 2021-02-20 MED ORDER — OMEPRAZOLE 40 MG PO CPDR
40.0000 mg | DELAYED_RELEASE_CAPSULE | Freq: Every day | ORAL | 0 refills | Status: DC
  Filled 2021-02-20 (×2): qty 42, 42d supply, fill #0

## 2021-02-20 MED ORDER — HUMIRA PEN 40 MG/0.8ML ~~LOC~~ PNKT
40.0000 mg | PEN_INJECTOR | SUBCUTANEOUS | 5 refills | Status: DC
  Filled 2021-02-20: qty 2, fill #0

## 2021-02-20 NOTE — Patient Instructions (Signed)
Contact information For emergencies after hours, on holidays or weekends: call [phone] and ask for the pediatric gastroenterologist on call.  For regular business hours: Pediatric GI phone number: Darlina Sicilian) [place] [phone] OR Use MyChart to send messages  A special favor Our waiting list is over 2 months. Other children are waiting to be seen in our clinic. If you cannot make your next appointment, please contact us with at least 2 days notice to cancel and reschedule. Your timely phone call will allow another child to use the clinic slot.  Thank you!

## 2021-02-20 NOTE — Progress Notes (Signed)
This is a Pediatric Specialist E-Visit follow up consult provided via video [place] consented to an E-Visit consult today.  Location of patient: Lonzy is at home (location) Location of provider: Harold Hedge is at [facility] (location) Patient was referred by Monna Fam, MD   The following participants were involved in this E-Visit: patient and me (list of participants and their roles)  Chief Complain/ Reason for E-Visit today: Ulcerative colitis Total time on call: 40 minutes, including 15 minutes of pre- and post-visit work Follow up: 6 months     Pediatric Gastroenterology Follow Up Visit   REFERRING PROVIDER:  Monna Fam, [address] [place],  [place] [address]   ASSESSMENT:     I had the pleasure of seeing Theodore Hopkins, 20 y.o. male (DOB: [birth year]) who I saw in follow up today for evaluation of ulcerative colitis (diagnosed 08/31/2015 at [facility]), which is limited the sigmoid and rectum colon. He had endoscopic re-evaluation in June 2021 at [facility], which showed stable disease extension in the rectosigmoid area. My impression is that Theodore Hopkins's ulcerative colitis is in remission. Theodore Hopkins is on Humira. Humira 3.3 mcg/mL (02/16/2020) - he did not do weekly injections as suggested but he is doing well on biweekly injections, so we will continue to monitor.  He has a history of sensitivity to Apriso (mesalamine). When he took mesalamine he had severe abdominal pain.   He has symptoms of early satiety and a sensation of pinching from the back of her throat to his mid-chest when sitting up or laying down. I suggested a trial of acid suppression to see if that helps. If it doesn't, we may need to do an evaluation. I asked for an update in 4 weeks via phone call or MyChart message.  Quantiferon Gold negative (08/13/19) - needs update HBsAb Negative  PLAN:  Humira 40 mg every 2 weeks Omeprazole 40 mg daily for 6 weeks CBC, CMP, ESR,  CRP, Quantiferon Gold - orders sent to [facility] Update in 4 weeks Next appointment in 6 months Thank you for allowing [place] to participate in the care of your patient      Thank you for allowing [place] to participate in the care of your patient      HISTORY OF PRESENT ILLNESS: Theodore Hopkins is a 20 y.o. male (DOB: 09/24/2000) who is seen in consultation for evaluation of ulcerative colitis. History was obtained from both [place]. Overall, he is doing well. Stools are 2 per day. The stools are formed consistency. There is rare blood in the stool. There is no abdominal pain. There is no vomiting. There is no nausea. Energy level is good. Appetite is good now that he is at home. Weight is down, which he attributes to eating less in school. He develops early satiety at school. He  has no signs of extraintestinal manifestations of active IBD, including dysphagia, fever, arthralgia, arthritis, back pain, jaundice, pruritus, erythema nodosum, eye redness, eye pain, shortness of breath, or oral ulceration.   He had RSV 2 months ago He is at sophomore at [employer] and wants to into the field of law.  08/31/2015 Normal TPMT activity (08/31/2015) Negative Quantiferon Gold HBsAb non rective  Chronic superficial gastritis  The sigmoid and rectal biopsies demonstrate evidence of active cryptitis, expansion of lamina propria by a lymphoplasmacytic infiltrate, focal paneth cell metaplasia and mild architectural distortion in the form of atrophy and crypt disarray. Overall the histologic findings are most concerning for idiopathic inflammatory  bowel disease, specifically ulcerative colitis.  06/06/16 D. Right/ascending colon, endoscopic biopsy: Chronic active colitis. No granuloma identified.   E. Cecum, endoscopic biopsy: Chronic active colitis. No granuloma identified.   F. Terminal ileum, endoscopic biopsy: Enteric mucosa with no specific pathologic diagnosis.   G. Left/descending colon, endoscopic  biopsy: Colonic mucosa with no specific pathologic diagnosis. No acute colitis,chronic mucosal injury or granuloma identified.  H. Sigmoid colon, endoscopic biopsy: Colonic mucosa with no specific pathologic diagnosis. No acute colitis,chronic mucosal injury or granuloma identified.  I. Rectum, endoscopic biopsy:  Colo-rectal mucosa with crypt architecture disarray. No acute colitis or granuloma   PAST MEDICAL HISTORY: Past Medical History:  Diagnosis Date   Ulcerative colitis ([place])    Immunization History  Administered Date(s) Administered   Influenza,inj,Quad PF,6+ Mos 12/20/2013, 12/24/2014, 01/19/2017, 01/22/2018   Influenza-Unspecified 12/04/2015   Moderna SARS-COV2 Booster Vaccination 03/01/2020   PAST SURGICAL HISTORY: History reviewed. No pertinent surgical history. SOCIAL HISTORY: Social History   Socioeconomic History   Marital status: Single    Spouse name: Not on file   Number of children: Not on file   Years of education: Not on file   Highest education level: Not on file  Occupational History   Not on file  Tobacco Use   Smoking status: Never   Smokeless tobacco: Never  Substance and Sexual Activity   Alcohol use: Never   Drug use: Never   Sexual activity: Never  Other Topics Concern   Not on file  Social History Narrative   Not working. 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   REVIEW OF SYSTEMS:  The balance of 12 systems reviewed is negative except as noted in the HPI.  MEDICATIONS: Current Outpatient Medications  Medication Sig Dispense Refill   Adalimumab (HUMIRA PEN) 40 MG/0.8ML PNKT Inject 40 mg into the  skin every 14 (fourteen) days. 2 each 5   No current facility-administered medications for this visit.   ALLERGIES: Cashew nut (anacardium occidentale) skin test, Cashew nut oil, Other, and Apriso [mesalamine er]  VITAL SIGNS: Ht 6' 1"  (1.854 m)    Wt 254 lb (115.2 kg) Comment: patient stated   BMI 33.51 kg/m  PHYSICAL EXAM: Looked well on video exam  DIAGNOSTIC STUDIES:  I have reviewed all pertinent diagnostic studies, including:  No results found for this or any previous visit (from the past 2160 hour(s)).   Anthoni Geerts A. Yehuda Savannah, MD Chief, Division of Pediatric Gastroenterology Professor of Pediatrics

## 2021-02-21 ENCOUNTER — Other Ambulatory Visit (HOSPITAL_COMMUNITY): Payer: Self-pay

## 2021-03-01 ENCOUNTER — Other Ambulatory Visit (HOSPITAL_COMMUNITY): Payer: Self-pay

## 2021-03-01 DIAGNOSIS — K5130 Ulcerative (chronic) rectosigmoiditis without complications: Secondary | ICD-10-CM | POA: Diagnosis not present

## 2021-03-03 ENCOUNTER — Other Ambulatory Visit (HOSPITAL_COMMUNITY): Payer: Self-pay

## 2021-03-15 ENCOUNTER — Other Ambulatory Visit (HOSPITAL_COMMUNITY): Payer: Self-pay

## 2021-03-15 MED ORDER — HUMIRA-CD/UC/HS STARTER 40 MG/0.8ML ~~LOC~~ PNKT
PEN_INJECTOR | SUBCUTANEOUS | 0 refills | Status: DC
  Filled 2021-04-04: qty 6, 28d supply, fill #0

## 2021-03-15 MED ORDER — HUMIRA (2 SYRINGE) 40 MG/0.4ML ~~LOC~~ PSKT: 40.0000 mg | PREFILLED_SYRINGE | SUBCUTANEOUS | 4 refills | Status: DC

## 2021-03-24 ENCOUNTER — Other Ambulatory Visit (HOSPITAL_COMMUNITY): Payer: Self-pay

## 2021-03-27 ENCOUNTER — Other Ambulatory Visit (HOSPITAL_COMMUNITY): Payer: Self-pay

## 2021-03-28 ENCOUNTER — Other Ambulatory Visit (HOSPITAL_COMMUNITY): Payer: Self-pay

## 2021-03-29 ENCOUNTER — Other Ambulatory Visit (HOSPITAL_COMMUNITY): Payer: Self-pay

## 2021-03-31 ENCOUNTER — Other Ambulatory Visit (HOSPITAL_COMMUNITY): Payer: Self-pay

## 2021-04-04 ENCOUNTER — Other Ambulatory Visit (HOSPITAL_COMMUNITY): Payer: Self-pay

## 2021-04-04 ENCOUNTER — Other Ambulatory Visit: Payer: Self-pay | Admitting: Pharmacist

## 2021-04-04 MED ORDER — HUMIRA-CD/UC/HS STARTER 40 MG/0.8ML ~~LOC~~ PNKT
PEN_INJECTOR | SUBCUTANEOUS | 0 refills | Status: DC
  Filled 2021-04-04: qty 6, fill #0
  Filled 2021-05-05: qty 6, 15d supply, fill #0
  Filled 2021-05-08: qty 6, 28d supply, fill #0

## 2021-04-04 MED ORDER — HUMIRA (2 SYRINGE) 40 MG/0.4ML ~~LOC~~ PSKT
40.0000 mg | PREFILLED_SYRINGE | SUBCUTANEOUS | 4 refills | Status: DC
  Filled 2021-04-04: qty 4, fill #0

## 2021-04-04 MED ORDER — HUMIRA PEN 40 MG/0.8ML ~~LOC~~ PNKT
0.8000 mL | PEN_INJECTOR | SUBCUTANEOUS | 3 refills | Status: DC
  Filled 2021-05-04: qty 2, 28d supply, fill #0

## 2021-04-05 ENCOUNTER — Other Ambulatory Visit (HOSPITAL_COMMUNITY): Payer: Self-pay

## 2021-04-14 ENCOUNTER — Other Ambulatory Visit (HOSPITAL_COMMUNITY): Payer: Self-pay

## 2021-04-20 ENCOUNTER — Other Ambulatory Visit (HOSPITAL_COMMUNITY): Payer: Self-pay

## 2021-05-01 ENCOUNTER — Other Ambulatory Visit (HOSPITAL_COMMUNITY): Payer: Self-pay

## 2021-05-01 ENCOUNTER — Telehealth (INDEPENDENT_AMBULATORY_CARE_PROVIDER_SITE_OTHER): Payer: Self-pay | Admitting: Pediatric Gastroenterology

## 2021-05-01 NOTE — Telephone Encounter (Signed)
Spoke to Theodore Hopkins at [facility]. Had him check the prescriptions for Theodore Hopkins. Theodore Hopkins also saw multiple prescriptions and attempted to run one of them through. A prior authorization is needed for the Theodore Hopkins, and this is why mom is unable to pick up the medication. The pharmacy also needs clarification on what dose of Theodore Hopkins Theodore Hopkins needs to take, as there are several forms/doses.

## 2021-05-01 NOTE — Telephone Encounter (Signed)
Returned phone call to mom. No answer. Left message to call the office. Provided phone number.

## 2021-05-01 NOTE — Telephone Encounter (Signed)
Mom returned phone call. She is unsure who the other prescriber is in the chart, but believes it may be related to the discount program that they receive for the Humira through the pharmacy. I also asked mom to verify the dose that Theodore Hopkins is receiving, as there is a 40 mg/0.8 mL and a 40 mg/0.4 mL prescribed. Mom is unsure because she did not have the medication for her to look at. I relayed to mom I will contact her back ASAP. Mom had no additional questions and we ended the call.

## 2021-05-01 NOTE — Telephone Encounter (Addendum)
Mom returned phone call. Mom stated that she received a phone call from [facility] with lab results after Lorne's last visit with Dr. Yehuda Savannah at [facility] in December. Mom stated that she thought that whoever she spoke to at [facility] stated that Hussein should have a bolus dose of his Humira based on those lab results. Mom stated that she is sure that [place] is not taking the medication as regularly as prescribed. A prescription for Humira was placed from [facility], but mom stated that the prescription was placed as he Citrate Free version and insurance will not pay for that. According to [facility] chart, there are several prescriptions for Humira with different doses, but mom stated that the pharmacy is telling her they do not have any prescriptions. Mom is confused with the switching back and forth between [facility] and [facility]. I relayed to mom that I will send this information to Dr. Yehuda Savannah and will contact her again when I have the answers for her. Mom was appreciative and had no additional questions.

## 2021-05-01 NOTE — Telephone Encounter (Signed)
°  Who's calling (name and relationship to patient) : Mom - on DPR  Best contact number: [phone]  Provider they see: Dr. Yehuda Savannah  Reason for call: Has question regarding Humira RX    PRESCRIPTION REFILL ONLY  Name of prescription:  Pharmacy:

## 2021-05-01 NOTE — Telephone Encounter (Signed)
Called mom to get more information, as 3 of the Humira prescriptions were put in by a different provider. No answer, left message to return call to office.

## 2021-05-01 NOTE — Telephone Encounter (Signed)
Sent secure email to Dr. Yehuda Savannah for further advisement.

## 2021-05-02 NOTE — Telephone Encounter (Signed)
Emailed [employer] a secure email to ask for additional help with this as requested by Dr. Yehuda Savannah.

## 2021-05-03 NOTE — Telephone Encounter (Signed)
Called and spoke to mom. Let mom know that were are still in the process of figuring out the medication situation for the Humira. Mom stated that she has not heard anything from [facility]. Mom was appreciative for the update and had no additional questions. ?

## 2021-05-04 ENCOUNTER — Other Ambulatory Visit (HOSPITAL_COMMUNITY): Payer: Self-pay

## 2021-05-04 NOTE — Telephone Encounter (Signed)
Received secure email from Levell July, Therapist, sports at [facility] - ?I called [facility] outpatient pharmacy and found out that I needed to submit an auth. I received a call in January that I needed to update the prescription and was aware that I had to submit another authorization. I have done this . I also contacted the family and left a message for them to contact me. ? ?

## 2021-05-05 ENCOUNTER — Other Ambulatory Visit (HOSPITAL_COMMUNITY): Payer: Self-pay

## 2021-05-08 ENCOUNTER — Other Ambulatory Visit (HOSPITAL_COMMUNITY): Payer: Self-pay

## 2021-05-08 DIAGNOSIS — L738 Other specified follicular disorders: Secondary | ICD-10-CM | POA: Diagnosis not present

## 2021-05-08 MED ORDER — DOXYCYCLINE HYCLATE 100 MG PO CAPS
100.0000 mg | ORAL_CAPSULE | Freq: Two times a day (BID) | ORAL | 0 refills | Status: DC
  Filled 2021-05-08: qty 60, 30d supply, fill #0

## 2021-05-09 ENCOUNTER — Other Ambulatory Visit (HOSPITAL_COMMUNITY): Payer: Self-pay

## 2021-05-11 ENCOUNTER — Other Ambulatory Visit (HOSPITAL_COMMUNITY): Payer: Self-pay

## 2021-05-18 ENCOUNTER — Other Ambulatory Visit (HOSPITAL_COMMUNITY): Payer: Self-pay

## 2021-06-02 ENCOUNTER — Other Ambulatory Visit (HOSPITAL_COMMUNITY): Payer: Self-pay

## 2021-06-05 ENCOUNTER — Other Ambulatory Visit (HOSPITAL_COMMUNITY): Payer: Self-pay

## 2021-06-27 ENCOUNTER — Other Ambulatory Visit (HOSPITAL_COMMUNITY): Payer: Self-pay

## 2021-07-04 ENCOUNTER — Other Ambulatory Visit (HOSPITAL_COMMUNITY): Payer: Self-pay

## 2021-07-25 ENCOUNTER — Other Ambulatory Visit (HOSPITAL_COMMUNITY): Payer: Self-pay

## 2021-08-09 ENCOUNTER — Other Ambulatory Visit (HOSPITAL_COMMUNITY): Payer: Self-pay

## 2021-09-01 ENCOUNTER — Other Ambulatory Visit (HOSPITAL_COMMUNITY): Payer: Self-pay

## 2021-09-01 ENCOUNTER — Ambulatory Visit
Admission: EM | Admit: 2021-09-01 | Discharge: 2021-09-01 | Disposition: A | Discharge status: Home / Self Care | Payer: 59 | Attending: Family Medicine | Admitting: Family Medicine

## 2021-09-01 ENCOUNTER — Other Ambulatory Visit (HOSPITAL_BASED_OUTPATIENT_CLINIC_OR_DEPARTMENT_OTHER): Payer: Self-pay

## 2021-09-01 DIAGNOSIS — J22 Unspecified acute lower respiratory infection: Secondary | ICD-10-CM

## 2021-09-01 DIAGNOSIS — R062 Wheezing: Secondary | ICD-10-CM

## 2021-09-01 DIAGNOSIS — J069 Acute upper respiratory infection, unspecified: Secondary | ICD-10-CM | POA: Diagnosis not present

## 2021-09-01 MED ORDER — AMOXICILLIN-POT CLAVULANATE 875-125 MG PO TABS
1.0000 | ORAL_TABLET | Freq: Two times a day (BID) | ORAL | 0 refills | Status: DC
  Filled 2021-09-01: qty 20, 10d supply, fill #0

## 2021-09-01 MED ORDER — ALBUTEROL SULFATE HFA 108 (90 BASE) MCG/ACT IN AERS
2.0000 | INHALATION_SPRAY | Freq: Four times a day (QID) | RESPIRATORY_TRACT | 0 refills | Status: AC | PRN
  Filled 2021-09-01: qty 6.7, 25d supply, fill #0

## 2021-09-01 MED ORDER — PROMETHAZINE-DM 6.25-15 MG/5ML PO SYRP
5.0000 mL | ORAL_SOLUTION | Freq: Three times a day (TID) | ORAL | 0 refills | Status: DC | PRN
  Filled 2021-09-01: qty 180, 12d supply, fill #0

## 2021-09-01 MED ORDER — PREDNISONE 20 MG PO TABS
20.0000 mg | ORAL_TABLET | Freq: Every day | ORAL | 0 refills | Status: AC
  Filled 2021-09-01: qty 5, 5d supply, fill #0

## 2021-09-01 NOTE — ED Triage Notes (Signed)
Patient c/o dry and productive cough for 3 weeks. The patient states the cough is worse when lying down.

## 2021-09-01 NOTE — ED Provider Notes (Signed)
[facility]    CSN: [id number] Arrival date & time: 09/01/21  1222      History   Chief Complaint Chief Complaint  Patient presents with  . Cough    HPI Theodore Hopkins is a 21 y.o. male.   HPI Patient presents today for evaluation of persistent cough, congestion, ear fullness ongoing for 3 weeks.  He has been taking a daily dose of an antihistamine without any relief of symptoms.  He reports a distant history of reactive airway/bronchiolitis symptoms in which he required a albuterol inhaler although denies any formal diagnosis of asthma.  Since the onset of symptoms he had some intermittent wheezing.  He reports at night difficulty sleeping due to cough.  Cough has been productive of mucus.  Recently he has developed some wheezing intermittently. Denies any fever throughout the course of illness.  He is currently immunosuppressed as he is on Humira every 2 weeks for UC.  Past Medical History:  Diagnosis Date  . Ulcerative colitis [facility])     Patient Active Problem List   Diagnosis Date Noted  . Ulcerative colitis ([place]) 08/26/2017    History reviewed. No pertinent surgical history.     Home Medications    Prior to Admission medications   Medication Sig Start Date End Date Taking? Authorizing Provider  Adalimumab (HUMIRA PEN) 40 MG/0.8ML PNKT Inject 40 mg into the skin every 14 (fourteen) days. 02/20/21 10/04/21  Tresa Garter, MD  Adalimumab (HUMIRA PEN) 40 MG/0.8ML PNKT Inject 0.8 mLs (40 mg total)  into the skin every 14 (fourteen) days. 04/04/21     Adalimumab (HUMIRA PEN-CD/UC/HS STARTER) 40 MG/0.8ML PNKT Inject 160 mg into the skin day 1, then 80 mg on day 15, then 40 mg every 1 week 04/04/21   Tresa Garter, MD  Adalimumab (HUMIRA) 40 MG/0.4ML PSKT Inject 40 mg into the skin once a week. 04/04/21   Tresa Garter, MD  doxycycline (VIBRAMYCIN) 100 MG capsule Take 1 capsule (100 mg total) by mouth 2 (two) times daily with food. 05/08/21      omeprazole (PRILOSEC) 40 MG capsule Take 1 capsule (40 mg total) by mouth daily. 02/20/21 04/03/21  Kandis Ban, MD    Family History Family History  Problem Relation Age of Onset  . Diabetes Father   . Diabetes Paternal Grandfather   . Colitis Neg Hx   . Irritable bowel syndrome Neg Hx   . Crohn's disease Neg Hx   . Celiac disease Neg Hx     Social History Social History   Tobacco Use  . Smoking status: Never  . Smokeless tobacco: Never  Substance Use Topics  . Alcohol use: Never  . Drug use: Never     Allergies   Cashew nut (anacardium occidentale) skin test, Cashew nut oil, Other, and Apriso [mesalamine er]   Review of Systems Review of Systems Pertinent negatives listed in HPI  Physical Exam Triage Vital Signs ED Triage Vitals  Enc Vitals Group     BP 09/01/21 1244 133/85     Pulse Rate 09/01/21 1244 93     Resp 09/01/21 1244 18     Temp 09/01/21 1244 98.2 F (36.8 C)     Temp Source 09/01/21 1244 Oral     SpO2 09/01/21 1244 95 %     Weight --      Height --      Head Circumference --      Peak Flow --  Pain Score 09/01/21 1243 0     Pain Loc --      Pain Edu? --      Excl. in [place]? --    No data found.  Updated Vital Signs BP 133/85 (BP Location: Left Arm)   Pulse 93   Temp 98.2 F (36.8 C) (Oral)   Resp 18   SpO2 95%   Visual Acuity Right Eye Distance:   Left Eye Distance:   Bilateral Distance:    Right Eye Near:   Left Eye Near:    Bilateral Near:     Physical Exam   UC Treatments / Results  Labs (all labs ordered are listed, but only abnormal results are displayed) Labs Reviewed - No data to display  EKG   Radiology No results found.  Procedures Procedures (including critical care time)  Medications Ordered in UC Medications - No data to display  Initial Impression / Assessment and Plan / UC Course  I have reviewed the triage vital signs and the nursing notes.  Pertinent labs & imaging results  that were available during my care of the patient were reviewed by me and considered in my medical decision making (see chart for details).     *** Final Clinical Impressions(s) / UC Diagnoses   Final diagnoses:  None   Discharge Instructions   None    ED Prescriptions   None    PDMP not reviewed this encounter.

## 2021-09-01 NOTE — Discharge Instructions (Addendum)
Resume Humira after completing prednisone.

## 2021-09-12 ENCOUNTER — Other Ambulatory Visit (HOSPITAL_COMMUNITY): Payer: Self-pay

## 2021-09-13 ENCOUNTER — Ambulatory Visit
Admission: EM | Admit: 2021-09-13 | Discharge: 2021-09-13 | Disposition: A | Discharge status: Home / Self Care | Payer: 59 | Attending: Emergency Medicine | Admitting: Emergency Medicine

## 2021-09-13 ENCOUNTER — Other Ambulatory Visit (HOSPITAL_COMMUNITY): Payer: Self-pay

## 2021-09-13 ENCOUNTER — Ambulatory Visit (INDEPENDENT_AMBULATORY_CARE_PROVIDER_SITE_OTHER): Payer: 59

## 2021-09-13 ENCOUNTER — Other Ambulatory Visit (HOSPITAL_BASED_OUTPATIENT_CLINIC_OR_DEPARTMENT_OTHER): Payer: Self-pay

## 2021-09-13 ENCOUNTER — Ambulatory Visit: Payer: Self-pay

## 2021-09-13 DIAGNOSIS — J310 Chronic rhinitis: Secondary | ICD-10-CM

## 2021-09-13 DIAGNOSIS — R062 Wheezing: Secondary | ICD-10-CM | POA: Diagnosis not present

## 2021-09-13 DIAGNOSIS — H66004 Acute suppurative otitis media without spontaneous rupture of ear drum, recurrent, right ear: Secondary | ICD-10-CM | POA: Diagnosis not present

## 2021-09-13 DIAGNOSIS — J329 Chronic sinusitis, unspecified: Secondary | ICD-10-CM

## 2021-09-13 DIAGNOSIS — R0982 Postnasal drip: Secondary | ICD-10-CM

## 2021-09-13 DIAGNOSIS — K5100 Ulcerative (chronic) pancolitis without complications: Secondary | ICD-10-CM

## 2021-09-13 MED ORDER — CETIRIZINE HCL 10 MG PO TABS
10.0000 mg | ORAL_TABLET | Freq: Every day | ORAL | 1 refills | Status: DC
  Filled 2021-09-13: qty 90, 90d supply, fill #0

## 2021-09-13 MED ORDER — FLUTICASONE PROPIONATE 50 MCG/ACT NA SUSP
1.0000 | Freq: Every day | NASAL | 2 refills | Status: AC
  Filled 2021-09-13: qty 16, 30d supply, fill #0

## 2021-09-13 MED ORDER — CEFDINIR 300 MG PO CAPS
300.0000 mg | ORAL_CAPSULE | Freq: Two times a day (BID) | ORAL | 0 refills | Status: DC
  Filled 2021-09-13: qty 20, 10d supply, fill #0

## 2021-09-13 MED ORDER — CEFDINIR 300 MG PO CAPS
300.0000 mg | ORAL_CAPSULE | Freq: Two times a day (BID) | ORAL | 0 refills | Status: AC
  Filled 2021-09-13: qty 20, 10d supply, fill #0

## 2021-09-13 MED ORDER — CETIRIZINE HCL 10 MG PO TABS
10.0000 mg | ORAL_TABLET | Freq: Every day | ORAL | 1 refills | Status: DC
  Filled 2021-09-13: qty 100, 100d supply, fill #0

## 2021-09-13 MED ORDER — FLUTICASONE PROPIONATE 50 MCG/ACT NA SUSP
1.0000 | Freq: Every day | NASAL | 2 refills | Status: DC
  Filled 2021-09-13: qty 16, 30d supply, fill #0

## 2021-09-13 NOTE — ED Provider Notes (Signed)
[facility]    CSN: [id number] Arrival date & time: 09/13/21  1434    HISTORY   Chief Complaint  Patient presents with   Otalgia   HPI Theodore Hopkins is a pleasant, 21 y.o. male who presents to urgent care today complaining of unresolved infection in his right ear.  Patient states he was seen about 2 weeks ago and prescribed a 7-day course of Augmentin and steroids for the infection and states he has taken them all as prescribed.  Patient states he feels like Q-tip is jabbing his eardrum at this time.  Patient states he has decreased hearing, pressure and ringing in his ear.  Patient states at this time, he is taking Sudafed and Claritin with no relief of his symptoms.  Patient states he also has a cough sometimes productive of sputum if he coughs forcefully, states he coughed so hard earlier today that he vomited but denies nausea and vomiting otherwise.  Patient states he has not been using the albuterol inhaler that was prescribed to him at his last visit due to his mother being concerned that it may flareup his ulcerative colitis, states he stopped taking his Humira due to the infection.  The history is provided by the patient.   Past Medical History:  Diagnosis Date   Ulcerative colitis [facility])    Patient Active Problem List   Diagnosis Date Noted   Ulcerative colitis ([place]) 08/26/2017   History reviewed. No pertinent surgical history.  Home Medications    Prior to Admission medications   Medication Sig Start Date End Date Taking? Authorizing Provider  cefdinir (OMNICEF) 300 MG capsule Take 1 capsule (300 mg total) by mouth 2 (two) times daily for 10 days. 09/13/21 09/23/21 Yes Lynden Oxford Scales, PA-C  cetirizine (ZYRTEC ALLERGY) 10 MG tablet Take 1 tablet (10 mg total) by mouth at bedtime. 09/13/21 03/12/22 Yes Lynden Oxford Scales, PA-C  fluticasone (FLONASE) 50 MCG/ACT nasal spray Place 1 spray into both nostrils daily. Begin by using 2 sprays in each nare  daily for 3 to 5 days, then decrease to 1 spray in each nare daily. 09/13/21  Yes Lynden Oxford Scales, PA-C  Adalimumab (HUMIRA PEN) 40 MG/0.8ML PNKT Inject 40 mg into the skin every 14 (fourteen) days. 02/20/21 10/10/21  Tresa Garter, MD  Adalimumab (HUMIRA PEN) 40 MG/0.8ML PNKT Inject 0.8 mLs (40 mg total)  into the skin every 14 (fourteen) days. 04/04/21     Adalimumab (HUMIRA PEN-CD/UC/HS STARTER) 40 MG/0.8ML PNKT Inject 160 mg into the skin day 1, then 80 mg on day 15, then 40 mg every 1 week 04/04/21   Tresa Garter, MD  Adalimumab (HUMIRA) 40 MG/0.4ML PSKT Inject 40 mg into the skin once a week. 04/04/21   Tresa Garter, MD  albuterol (VENTOLIN HFA) 108 (90 Base) MCG/ACT inhaler Inhale 2 puffs by mouth into the lungs every 6 (six) hours as needed for wheezing or shortness of breath. 09/01/21   Scot Jun, FNP    Family History Family History  Problem Relation Age of Onset   Diabetes Father    Diabetes Paternal Grandfather    Colitis Neg Hx    Irritable bowel syndrome Neg Hx    Crohn's disease Neg Hx    Celiac disease Neg Hx    Social History Social History   Tobacco Use   Smoking status: Never   Smokeless tobacco: Never  Substance Use Topics   Alcohol use: Never   Drug use: Never  Allergies   Cashew nut (anacardium occidentale) skin test, Cashew nut oil, Other, and Apriso [mesalamine er]  Review of Systems Review of Systems Pertinent findings revealed after performing a 14 point review of systems has been noted in the history of present illness.  Physical Exam Triage Vital Signs ED Triage Vitals  Enc Vitals Group     BP 12/30/20 0827 (!) 147/82     Pulse Rate 12/30/20 0827 72     Resp 12/30/20 0827 18     Temp 12/30/20 0827 98.3 F (36.8 C)     Temp Source 12/30/20 0827 Oral     SpO2 12/30/20 0827 98 %     Weight --      Height --      Head Circumference --      Peak Flow --      Pain Score 12/30/20 0826 5     Pain Loc --      Pain  Edu? --      Excl. in [place]? --   No data found.  Updated Vital Signs BP 123/74 (BP Location: Right Arm)   Pulse 96   Temp 98.4 F (36.9 C) (Oral)   Resp 16   SpO2 96%   Physical Exam Vitals and nursing note reviewed.  Constitutional:      General: He is not in acute distress.    Appearance: Normal appearance. He is not ill-appearing.  HENT:     Head: Normocephalic and atraumatic.     Salivary Glands: Right salivary gland is not diffusely enlarged or tender. Left salivary gland is not diffusely enlarged or tender.     Right Ear: Ear canal and external ear normal. Decreased hearing noted. No drainage. No middle ear effusion. There is no impacted cerumen. Tympanic membrane is injected, scarred, erythematous and retracted. Tympanic membrane is not bulging.     Left Ear: Hearing, ear canal and external ear normal. No drainage.  No middle ear effusion. There is no impacted cerumen. Tympanic membrane is bulging (Clear fluid). Tympanic membrane is not injected or erythematous.     Nose: Rhinorrhea present. No nasal deformity, septal deviation, signs of injury, nasal tenderness, mucosal edema or congestion. Rhinorrhea is clear.     Right Nostril: Occlusion present. No foreign body, epistaxis or septal hematoma.     Left Nostril: Occlusion present. No foreign body, epistaxis or septal hematoma.     Right Turbinates: Enlarged, swollen and pale.     Left Turbinates: Enlarged, swollen and pale.     Right Sinus: No maxillary sinus tenderness or frontal sinus tenderness.     Left Sinus: No maxillary sinus tenderness or frontal sinus tenderness.     Mouth/Throat:     Lips: Pink. No lesions.     Mouth: Mucous membranes are moist. No oral lesions.     Tongue: No lesions. Tongue does not deviate from midline.     Palate: No mass and lesions.     Pharynx: Oropharynx is clear. Uvula midline. No posterior oropharyngeal erythema or uvula swelling.     Tonsils: No tonsillar exudate. 0 on the right. 0 on the  left.     Comments: + Postnasal drip Eyes:     General: Lids are normal.        Right eye: No discharge.        Left eye: No discharge.     Extraocular Movements: Extraocular movements intact.     Conjunctiva/sclera: Conjunctivae normal.     Right eye: Right  conjunctiva is not injected.     Left eye: Left conjunctiva is not injected.  Neck:     Trachea: Trachea and phonation normal.  Cardiovascular:     Rate and Rhythm: Normal rate and regular rhythm.     Pulses: Normal pulses.     Heart sounds: Normal heart sounds. No murmur heard.    No friction rub. No gallop.  Pulmonary:     Effort: Pulmonary effort is normal. No tachypnea, bradypnea, accessory muscle usage, prolonged expiration, respiratory distress or retractions.     Breath sounds: No stridor, decreased air movement or transmitted upper airway sounds. Examination of the right-upper field reveals wheezing. Examination of the right-middle field reveals wheezing. Examination of the right-lower field reveals wheezing. Wheezing present. No decreased breath sounds, rhonchi or rales.  Chest:     Chest wall: No tenderness.  Musculoskeletal:        General: Normal range of motion.     Cervical back: Normal range of motion and neck supple. Normal range of motion.  Lymphadenopathy:     Cervical: No cervical adenopathy.  Skin:    General: Skin is warm and dry.     Findings: No erythema or rash.  Neurological:     General: No focal deficit present.     Mental Status: He is alert and oriented to person, place, and time.  Psychiatric:        Mood and Affect: Mood normal.        Behavior: Behavior normal.     Visual Acuity Right Eye Distance:   Left Eye Distance:   Bilateral Distance:    Right Eye Near:   Left Eye Near:    Bilateral Near:     [facility] / Diagnostics / Procedures:     Radiology DG Chest 2 View  Result Date: 09/13/2021 CLINICAL DATA:  Wheezing. EXAM: CHEST - 2 VIEW COMPARISON:  Jul 30, 2017 FINDINGS:  Cardiomediastinal silhouette is normal. Mediastinal contours appear intact. There is no evidence of focal airspace consolidation, pleural effusion or pneumothorax. Osseous structures are without acute abnormality. Soft tissues are grossly normal. IMPRESSION: No active cardiopulmonary disease. Technical artifact on both frontal and lateral views which make the evaluation suboptimal. Electronically Signed   By: Fidela Salisbury M.D.   On: 09/13/2021 15:40    Procedures Procedures (including critical care time) EKG  Pending results:  Labs Reviewed - No data to display  Medications Ordered in UC: Medications - No data to display  UC Diagnoses / Final Clinical Impressions(s)   I have reviewed the triage vital signs and the nursing notes.  Pertinent labs & imaging results that were available during my care of the patient were reviewed by me and considered in my medical decision making (see chart for details).    Final diagnoses:  Recurrent acute suppurative otitis media of right ear without spontaneous rupture of tympanic membrane  Ulcerative pancolitis (HCC)  Rhinosinusitis  Post-nasal drip   Chest x-ray reassuring.  Patient advised that albuterol HFA is minimally absorbed in the body and unquantifiable amounts that he should progressively use it for the wheezing in his lungs at this time.  Do not recommend steroids as this may have interfered with Augmentin's ability to resolve his ear infection at the first go around.  Patient advised to take cefdinir for 10 days.  Patient also advised to switch from Claritin to Zyrtec and provided with prescription for Flonase as well.  Return precautions advised.  Patient advised to resume Humira  once he has completed cefdinir but if he would like to be evaluated prior to this we will be happy to make sure the infection is completely resolved before he resumes.  ED Prescriptions     Medication Sig Dispense Auth. Provider   cetirizine (ZYRTEC ALLERGY) 10  MG tablet Take 1 tablet (10 mg total) by mouth at bedtime. 90 tablet Lynden Oxford Scales, PA-C   fluticasone (FLONASE) 50 MCG/ACT nasal spray Place 1 spray into both nostrils daily. Begin by using 2 sprays in each nare daily for 3 to 5 days, then decrease to 1 spray in each nare daily. 15.8 mL Lynden Oxford Scales, PA-C   cefdinir (OMNICEF) 300 MG capsule Take 1 capsule (300 mg total) by mouth 2 (two) times daily for 10 days. 20 capsule Lynden Oxford Scales, PA-C      PDMP not reviewed this encounter.  Disposition Upon Discharge:  Condition: stable for discharge home Home: take medications as prescribed; routine discharge instructions as discussed; follow up as advised.  Patient presented with an acute illness with associated systemic symptoms and significant discomfort requiring urgent management. In my opinion, this is a condition that a prudent lay person (someone who possesses an average knowledge of health and medicine) may potentially expect to result in complications if not addressed urgently such as respiratory distress, impairment of bodily function or dysfunction of bodily organs.   Routine symptom specific, illness specific and/or disease specific instructions were discussed with the patient and/or caregiver at length.   As such, the patient has been evaluated and assessed, work-up was performed and treatment was provided in alignment with urgent care protocols and evidence based medicine.  Patient/parent/caregiver has been advised that the patient may require follow up for further testing and treatment if the symptoms continue in spite of treatment, as clinically indicated and appropriate.  If the patient was tested for COVID-19, Influenza and/or RSV, then the patient/parent/guardian was advised to isolate at home pending the results of his/her diagnostic coronavirus test and potentially longer if they're positive. I have also advised pt that if his/her COVID-19 test returns  positive, it's recommended to self-isolate for at least 10 days after symptoms first appeared AND until fever-free for 24 hours without fever reducer AND other symptoms have improved or resolved. Discussed self-isolation recommendations as well as instructions for household member/close contacts as per the [facility] and [place] DHHS, and also gave patient the [place] packet with this information.  Patient/parent/caregiver has been advised to return to the [facility] or PCP in 3-5 days if no better; to PCP or the Emergency Department if new signs and symptoms develop, or if the current signs or symptoms continue to change or worsen for further workup, evaluation and treatment as clinically indicated and appropriate  The patient will follow up with their current PCP if and as advised. If the patient does not currently have a PCP we will assist them in obtaining one.   The patient may need specialty follow up if the symptoms continue, in spite of conservative treatment and management, for further workup, evaluation, consultation and treatment as clinically indicated and appropriate.  Patient/parent/caregiver verbalized understanding and agreement of plan as discussed.  All questions were addressed during visit.  Please see discharge instructions below for further details of plan.  Discharge Instructions:   Discharge Instructions      Your chest x-ray today is reassuring, there is no sign of pneumonia or consolidation at this time.  To be certain, I did check pharmaceutical reference  to ensure that I am correct that albuterol, when inhaled, is not absorbed into your body and measurable amounts meaning it is nearly undetectable when inhaled and should therefore not interfere or aggravate your ulcerative colitis in any way.  Please inhale 2 puffs of albuterol 4 times daily for the next 3 to 4 days, then decrease to twice daily, then decrease to as needed for coughing and feeling short of breath.  I would like for you to  begin taking Flonase and Zyrtec as well.  Flonase is a topical steroid which, like albuterol, is not systemically absorbed into your body and will reduce inflammation in your nose and sinuses and reduce mucus production which is likely perpetuating the infection in your right ear.  Zyrtec is an antihistamine and will also help reduce inflammation, allow the excess fluid and your upper respiratory system to drain freely and remove the warm, moist environment on which the bacteria in your ears currently thriving.  To treat the persistent infection in your right ear, I recommend that you begin a 10-day course of a different antibiotic called cefdinir.  Cefdinir covers broader spectrum of bacterial organisms and Augmentin does and the 10-day course is actually recommended over 7-day course for treatment of ear infections.  The provider that you saw at the end of June is correct, you should not be taking Humira while you have active infection.  I anticipate that she will be able to resume Humira after completing cefdinir but if you are uncertain whether or not your infection is completely resolved, please feel free to return for repeat evaluation once you have completed cefdinir so that you can feel comfortable about resuming Humira.  At this time, I also do not recommend oral steroids for the same reason that you should not be on Humira with this active infection in your ear.  Thank you for visiting urgent care today.    This office note has been dictated using Museum/gallery curator.  Unfortunately, this method of dictation can sometimes lead to typographical or grammatical errors.  I apologize for your inconvenience in advance if this occurs.  Please do not hesitate to reach out to me if clarification is needed.      Lynden Oxford Scales, PA-C 09/13/21 [phone]

## 2021-09-13 NOTE — ED Triage Notes (Signed)
Pt. States 2 week ago s/s started in right ear: tinnitus; decreased hearing; pressure. Pt. allergic to prezo. Pt. Stated "it feels like a q-tip is jabbing is eardrum". Pt. Is currently taking Rx: Humera, Claritin, current prescribed antibiotics. Pt. Needs Dr. Donnita Falls for missing work.

## 2021-09-13 NOTE — Discharge Instructions (Addendum)
Your chest x-ray today is reassuring, there is no sign of pneumonia or consolidation at this time.  To be certain, I did check pharmaceutical reference to ensure that I am correct that albuterol, when inhaled, is not absorbed into your body and measurable amounts meaning it is nearly undetectable when inhaled and should therefore not interfere or aggravate your ulcerative colitis in any way.  Please inhale 2 puffs of albuterol 4 times daily for the next 3 to 4 days, then decrease to twice daily, then decrease to as needed for coughing and feeling short of breath.  I would like for you to begin taking Flonase and Zyrtec as well.  Flonase is a topical steroid which, like albuterol, is not systemically absorbed into your body and will reduce inflammation in your nose and sinuses and reduce mucus production which is likely perpetuating the infection in your right ear.  Zyrtec is an antihistamine and will also help reduce inflammation, allow the excess fluid and your upper respiratory system to drain freely and remove the warm, moist environment on which the bacteria in your ears currently thriving.  To treat the persistent infection in your right ear, I recommend that you begin a 10-day course of a different antibiotic called cefdinir.  Cefdinir covers broader spectrum of bacterial organisms and Augmentin does and the 10-day course is actually recommended over 7-day course for treatment of ear infections.  The provider that you saw at the end of June is correct, you should not be taking Humira while you have active infection.  I anticipate that she will be able to resume Humira after completing cefdinir but if you are uncertain whether or not your infection is completely resolved, please feel free to return for repeat evaluation once you have completed cefdinir so that you can feel comfortable about resuming Humira.  At this time, I also do not recommend oral steroids for the same reason that you should  not be on Humira with this active infection in your ear.  Thank you for visiting urgent care today.

## 2021-09-21 ENCOUNTER — Ambulatory Visit: Payer: 59 | Attending: Pediatrics | Admitting: Pharmacist

## 2021-09-21 DIAGNOSIS — Z79899 Other long term (current) drug therapy: Secondary | ICD-10-CM

## 2021-09-21 NOTE — Progress Notes (Addendum)
  S: Patient presents for review of their specialty medication therapy.  Patient is currently taking Humira for UC. Patient is managed by Dr. Yehuda Savannah for this.   Adherence: confirmed  Efficacy: confirmed  Dosing:  Ulcerative colitis:  Maintenance: 40 mg every other week  Dose adjustments: Renal: no dose adjustments (has not been studied) Hepatic: no dose adjustments (has not been studied)  Drug-drug interactions: none identified   Screening: TB test: completed, negative 08/13/2019 Hepatitis: completed; HBsAb negative   Monitoring: S/sx of infection: none  CBC: monitored by [facility]; see Care Everywhere  S/sx of hypersensitivity: none  S/sx of malignancy: none  S/sx of heart failure: none   Other side effects:  -Significant injection site rxn d/t citrate buffer. Endorses 6-7 in diameter circular area of erythema around the injection site. Resolves in 3-4 days after injection.  O:   Lab Results  Component Value Date   WBC 5.2 10/09/2018   HGB 14.3 10/09/2018   HCT 43.7 10/09/2018   MCV 80.0 10/09/2018   PLT 303 10/09/2018      Chemistry      Component Value Date/Time   NA 141 10/09/2018 1544   K 4.3 10/09/2018 1544   CL 102 10/09/2018 1544   CO2 31 10/09/2018 1544   BUN 9 10/09/2018 1544   CREATININE 0.99 10/09/2018 1544      Component Value Date/Time   CALCIUM 10.1 10/09/2018 1544   ALKPHOS 179 (H) 07/30/2017 2028   AST 18 10/09/2018 1544   ALT 10 10/09/2018 1544   BILITOT 0.4 10/09/2018 1544       A/P: 1. Medication review: Patient currently taking Humira for [place]. Reviewed the medication with the patient, including the following: Humira is a TNF blocking agent indicated for ankylosing spondylitis, Crohn's disease, Hidradenitis suppurativa, psoriatic arthritis, plaque psoriasis, ulcerative colitis, and uveitis. Patient educated on purpose, proper use and potential adverse effects of Humira. Possible adverse effects are increased risk of infections,  headache, and injection site reactions. There is the possibility of an increased risk of malignancy but it is not well understood if this increased risk is due to there medication or the disease state. There are rare cases of pancytopenia and aplastic anemia. For SubQ injection at separate sites in the thigh or lower abdomen (avoiding areas within 2 inches of navel); rotate injection sites. May leave at room temperature for ~15 to 30 minutes prior to use; do not remove cap or cover while allowing product to reach room temperature. Do not use if solution is discolored or contains particulate matter. Do not administer to skin which is red, tender, bruised, hard, or that has scars, stretch marks, or psoriasis plaques. Needle cap of the prefilled syringe or needle cover for the adalimumab pen may contain latex. Prefilled pens and syringes are available for use by patients and the full amount of the syringe should be injected (self-administration); the vial is intended for institutional use only. Vials do not contain a preservative; discard unused portion. No recommendations for any changes at this time.   Benard Halsted, PharmD, [place], [place] [phone]

## 2021-09-25 DIAGNOSIS — H5213 Myopia, bilateral: Secondary | ICD-10-CM | POA: Diagnosis not present

## 2021-10-09 ENCOUNTER — Other Ambulatory Visit (HOSPITAL_COMMUNITY): Payer: Self-pay

## 2021-10-10 ENCOUNTER — Other Ambulatory Visit (HOSPITAL_COMMUNITY): Payer: Self-pay

## 2021-10-17 ENCOUNTER — Other Ambulatory Visit (HOSPITAL_COMMUNITY): Payer: Self-pay

## 2021-11-07 ENCOUNTER — Other Ambulatory Visit (INDEPENDENT_AMBULATORY_CARE_PROVIDER_SITE_OTHER): Payer: Self-pay | Admitting: Pediatric Gastroenterology

## 2021-11-07 ENCOUNTER — Other Ambulatory Visit (HOSPITAL_COMMUNITY): Payer: Self-pay

## 2021-11-07 ENCOUNTER — Other Ambulatory Visit: Payer: Self-pay | Admitting: Pharmacist

## 2021-11-07 MED ORDER — HUMIRA PEN 40 MG/0.8ML ~~LOC~~ PNKT
40.0000 mg | PEN_INJECTOR | SUBCUTANEOUS | 5 refills | Status: DC
  Filled 2021-11-07: qty 2, 28d supply, fill #0
  Filled 2021-12-01: qty 2, 28d supply, fill #1
  Filled 2021-12-21: qty 2, 28d supply, fill #2
  Filled 2022-01-22: qty 2, 28d supply, fill #3
  Filled 2022-02-16: qty 2, 28d supply, fill #4

## 2021-11-07 MED ORDER — HUMIRA PEN 40 MG/0.8ML ~~LOC~~ PNKT
40.0000 mg | PEN_INJECTOR | SUBCUTANEOUS | 5 refills | Status: DC
  Filled 2021-11-07: qty 2, 28d supply, fill #0

## 2021-11-08 ENCOUNTER — Other Ambulatory Visit (HOSPITAL_COMMUNITY): Payer: Self-pay

## 2021-11-13 ENCOUNTER — Other Ambulatory Visit (HOSPITAL_COMMUNITY): Payer: Self-pay

## 2021-12-01 ENCOUNTER — Other Ambulatory Visit (HOSPITAL_COMMUNITY): Payer: Self-pay

## 2021-12-04 ENCOUNTER — Other Ambulatory Visit (HOSPITAL_COMMUNITY): Payer: Self-pay

## 2021-12-21 ENCOUNTER — Other Ambulatory Visit (HOSPITAL_COMMUNITY): Payer: Self-pay

## 2021-12-25 ENCOUNTER — Other Ambulatory Visit (HOSPITAL_COMMUNITY): Payer: Self-pay

## 2022-01-17 ENCOUNTER — Other Ambulatory Visit (HOSPITAL_COMMUNITY): Payer: Self-pay

## 2022-01-19 ENCOUNTER — Other Ambulatory Visit (HOSPITAL_COMMUNITY): Payer: Self-pay

## 2022-01-22 ENCOUNTER — Other Ambulatory Visit (HOSPITAL_COMMUNITY): Payer: Self-pay

## 2022-01-29 ENCOUNTER — Other Ambulatory Visit (HOSPITAL_COMMUNITY): Payer: Self-pay

## 2022-02-12 ENCOUNTER — Other Ambulatory Visit (HOSPITAL_COMMUNITY): Payer: Self-pay

## 2022-02-14 ENCOUNTER — Other Ambulatory Visit: Payer: Self-pay

## 2022-02-15 ENCOUNTER — Ambulatory Visit
Admission: RE | Admit: 2022-02-15 | Discharge: 2022-02-15 | Disposition: A | Discharge status: Home / Self Care | Payer: 59 | Source: Ambulatory Visit | Attending: Urgent Care | Admitting: Urgent Care

## 2022-02-15 ENCOUNTER — Other Ambulatory Visit (HOSPITAL_BASED_OUTPATIENT_CLINIC_OR_DEPARTMENT_OTHER): Payer: Self-pay

## 2022-02-15 VITALS — BP 126/87 | HR 79 | Temp 98.0°F | Resp 20

## 2022-02-15 DIAGNOSIS — J309 Allergic rhinitis, unspecified: Secondary | ICD-10-CM | POA: Diagnosis not present

## 2022-02-15 DIAGNOSIS — J019 Acute sinusitis, unspecified: Secondary | ICD-10-CM

## 2022-02-15 MED ORDER — AMOXICILLIN-POT CLAVULANATE 875-125 MG PO TABS
1.0000 | ORAL_TABLET | Freq: Two times a day (BID) | ORAL | 0 refills | Status: DC
  Filled 2022-02-15: qty 14, 7d supply, fill #0

## 2022-02-15 MED ORDER — PREDNISONE 20 MG PO TABS
40.0000 mg | ORAL_TABLET | Freq: Every day | ORAL | 0 refills | Status: AC
  Filled 2022-02-15: qty 10, 5d supply, fill #0

## 2022-02-15 MED ORDER — CETIRIZINE HCL 10 MG PO TABS
10.0000 mg | ORAL_TABLET | Freq: Every day | ORAL | 0 refills | Status: AC
  Filled 2022-02-15: qty 100, 90d supply, fill #0

## 2022-02-15 NOTE — ED Triage Notes (Signed)
Pt c/o sinus pressure, bilat earache x 2-3 weeks, nasal congestion since August-NAD-steady gait

## 2022-02-15 NOTE — ED Provider Notes (Signed)
[facility]  Note:  This document was prepared using Systems analyst and may include unintentional dictation errors.  MRN: [id number] DOB: [birth year]  Subjective:   Theodore Hopkins is a 21 y.o. male presenting for several month history of persistent sinus congestion and drainage.  In the past 2 to 3 weeks has had more sinus pressure/pain, bilateral ear pains.  No chest pain, shortness of breath or wheezing.  Takes Claritin daily and consistently.  Also uses pseudoephedrine.  No current facility-administered medications for this encounter.  Current Outpatient Medications:    Adalimumab (HUMIRA PEN) 40 MG/0.8ML PNKT, Inject 40 mg into the skin every 14 (fourteen) days., Disp: 2 each, Rfl: 5   albuterol (VENTOLIN HFA) 108 (90 Base) MCG/ACT inhaler, Inhale 2 puffs by mouth into the lungs every 6 (six) hours as needed for wheezing or shortness of breath., Disp: 6.7 g, Rfl: 0   cetirizine (ZYRTEC ALLERGY) 10 MG tablet, Take 1 tablet (10 mg total) by mouth at bedtime., Disp: 90 tablet, Rfl: 1   fluticasone (FLONASE) 50 MCG/ACT nasal spray, Place 1 spray into both nostrils daily. Begin by using 2 sprays in each nare daily for 3 to 5 days, then decrease to 1 spray in each nare daily., Disp: 16 g, Rfl: 2   Allergies  Allergen Reactions   Cashew Nut (Anacardium Occidentale) Skin Test Shortness Of Breath   Cashew Nut Oil Shortness Of Breath   Other     Almonds   Veneda Melter Er]     Past Medical History:  Diagnosis Date   Ulcerative colitis ([place])      History reviewed. No pertinent surgical history.  Family History  Problem Relation Age of Onset   Diabetes Father    Diabetes Paternal Grandfather    Colitis Neg Hx    Irritable bowel syndrome Neg Hx    Crohn's disease Neg Hx    Celiac disease Neg Hx     Social History   Tobacco Use   Smoking status: Never   Smokeless tobacco: Never  Vaping Use   Vaping Use: Never used   Substance Use Topics   Alcohol use: Yes    Comment: weekly   Drug use: Yes    Types: Marijuana    ROS   Objective:   Vitals: BP 126/87 (BP Location: Left Arm)   Pulse 79   Temp 98 F (36.7 C)   Resp 20   SpO2 97%   Physical Exam Constitutional:      General: He is not in acute distress.    Appearance: Normal appearance. He is normal weight. He is not ill-appearing, toxic-appearing or diaphoretic.  HENT:     Head: Normocephalic and atraumatic.     Right Ear: Tympanic membrane, ear canal and external ear normal. No drainage, swelling or tenderness. No middle ear effusion. There is no impacted cerumen. Tympanic membrane is not erythematous or bulging.     Left Ear: Tympanic membrane, ear canal and external ear normal. No drainage, swelling or tenderness.  No middle ear effusion. There is no impacted cerumen. Tympanic membrane is not erythematous or bulging.     Nose: Congestion present. No rhinorrhea.     Mouth/Throat:     Mouth: Mucous membranes are moist.     Pharynx: No oropharyngeal exudate or posterior oropharyngeal erythema.  Eyes:     General: No scleral icterus.       Right eye: No discharge.  Left eye: No discharge.     Extraocular Movements: Extraocular movements intact.     Conjunctiva/sclera: Conjunctivae normal.  Cardiovascular:     Rate and Rhythm: Normal rate.  Pulmonary:     Effort: Pulmonary effort is normal.  Musculoskeletal:     Cervical back: Normal range of motion and neck supple. No rigidity. No muscular tenderness.  Neurological:     General: No focal deficit present.     Mental Status: He is alert and oriented to person, place, and time.     Cranial Nerves: No cranial nerve deficit.     Motor: No weakness.     Coordination: Coordination normal.     Gait: Gait normal.  Psychiatric:        Mood and Affect: Mood normal.        Behavior: Behavior normal.     Assessment and Plan :   PDMP not reviewed this encounter.  1. Acute  non-recurrent sinusitis, unspecified location   2. Allergic rhinitis, unspecified seasonality, unspecified trigger     Will start empiric treatment for sinusitis with Augmentin.  Given persistent several month history of his allergic rhinitis recommended oral prednisone course.  Switch from Claritin to Zyrtec and use pseudoephedrine as needed once he completes prednisone course.  Counseled patient on potential for adverse effects with medications prescribed/recommended today, ER and return-to-clinic precautions discussed, patient verbalized understanding.    Jaynee Eagles, [place] 02/15/22 1617

## 2022-02-16 ENCOUNTER — Other Ambulatory Visit: Payer: Self-pay

## 2022-02-16 ENCOUNTER — Other Ambulatory Visit (HOSPITAL_COMMUNITY): Payer: Self-pay

## 2022-02-20 ENCOUNTER — Other Ambulatory Visit: Payer: Self-pay

## 2022-03-09 ENCOUNTER — Other Ambulatory Visit (HOSPITAL_COMMUNITY): Payer: Self-pay

## 2022-03-20 ENCOUNTER — Other Ambulatory Visit (HOSPITAL_COMMUNITY): Payer: Self-pay

## 2022-03-20 MED ORDER — HUMIRA (2 PEN) 40 MG/0.8ML ~~LOC~~ AJKT
AUTO-INJECTOR | SUBCUTANEOUS | 0 refills | Status: AC
  Filled 2022-03-26: qty 2, 28d supply, fill #0

## 2022-03-22 ENCOUNTER — Other Ambulatory Visit (HOSPITAL_COMMUNITY): Payer: Self-pay

## 2022-03-26 ENCOUNTER — Other Ambulatory Visit (HOSPITAL_COMMUNITY): Payer: Self-pay

## 2022-03-26 ENCOUNTER — Other Ambulatory Visit: Payer: Self-pay

## 2022-03-27 ENCOUNTER — Other Ambulatory Visit: Payer: Self-pay

## 2022-03-27 ENCOUNTER — Other Ambulatory Visit (HOSPITAL_COMMUNITY): Payer: Self-pay

## 2022-03-28 ENCOUNTER — Other Ambulatory Visit (HOSPITAL_COMMUNITY): Payer: Self-pay

## 2022-03-28 ENCOUNTER — Other Ambulatory Visit: Payer: Self-pay

## 2022-04-09 ENCOUNTER — Other Ambulatory Visit (HOSPITAL_COMMUNITY): Payer: Self-pay

## 2022-04-18 ENCOUNTER — Other Ambulatory Visit (INDEPENDENT_AMBULATORY_CARE_PROVIDER_SITE_OTHER): Payer: Self-pay | Admitting: Pediatric Gastroenterology

## 2022-04-18 ENCOUNTER — Other Ambulatory Visit (HOSPITAL_COMMUNITY): Payer: Self-pay

## 2022-04-19 ENCOUNTER — Other Ambulatory Visit (HOSPITAL_COMMUNITY): Payer: Self-pay

## 2022-04-19 ENCOUNTER — Other Ambulatory Visit (INDEPENDENT_AMBULATORY_CARE_PROVIDER_SITE_OTHER): Payer: Self-pay | Admitting: Pediatric Gastroenterology

## 2022-04-23 ENCOUNTER — Other Ambulatory Visit (HOSPITAL_COMMUNITY): Payer: Self-pay

## 2022-04-24 ENCOUNTER — Other Ambulatory Visit (HOSPITAL_COMMUNITY): Payer: Self-pay

## 2022-04-27 ENCOUNTER — Other Ambulatory Visit (HOSPITAL_COMMUNITY): Payer: Self-pay

## 2022-05-03 ENCOUNTER — Other Ambulatory Visit (HOSPITAL_COMMUNITY): Payer: Self-pay

## 2022-05-17 ENCOUNTER — Other Ambulatory Visit (HOSPITAL_COMMUNITY): Payer: Self-pay

## 2022-06-08 ENCOUNTER — Other Ambulatory Visit (HOSPITAL_COMMUNITY): Payer: Self-pay

## 2022-06-11 DIAGNOSIS — R531 Weakness: Secondary | ICD-10-CM | POA: Diagnosis not present

## 2022-06-11 DIAGNOSIS — S060X0A Concussion without loss of consciousness, initial encounter: Secondary | ICD-10-CM | POA: Diagnosis not present

## 2022-06-12 DIAGNOSIS — S199XXA Unspecified injury of neck, initial encounter: Secondary | ICD-10-CM | POA: Diagnosis not present

## 2022-06-12 DIAGNOSIS — S0011XA Contusion of right eyelid and periocular area, initial encounter: Secondary | ICD-10-CM | POA: Diagnosis not present

## 2022-06-12 DIAGNOSIS — S0012XA Contusion of left eyelid and periocular area, initial encounter: Secondary | ICD-10-CM | POA: Diagnosis not present

## 2022-06-12 DIAGNOSIS — M542 Cervicalgia: Secondary | ICD-10-CM | POA: Diagnosis not present

## 2022-06-12 DIAGNOSIS — S022XXA Fracture of nasal bones, initial encounter for closed fracture: Secondary | ICD-10-CM | POA: Diagnosis not present

## 2022-06-12 DIAGNOSIS — R531 Weakness: Secondary | ICD-10-CM | POA: Diagnosis not present

## 2022-06-12 DIAGNOSIS — R29898 Other symptoms and signs involving the musculoskeletal system: Secondary | ICD-10-CM | POA: Diagnosis not present

## 2022-06-14 ENCOUNTER — Other Ambulatory Visit (HOSPITAL_COMMUNITY): Payer: Self-pay

## 2022-06-18 DIAGNOSIS — J342 Deviated nasal septum: Secondary | ICD-10-CM | POA: Diagnosis not present

## 2022-06-18 DIAGNOSIS — J3489 Other specified disorders of nose and nasal sinuses: Secondary | ICD-10-CM | POA: Diagnosis not present

## 2022-06-22 DIAGNOSIS — M950 Acquired deformity of nose: Secondary | ICD-10-CM | POA: Diagnosis not present

## 2022-06-22 DIAGNOSIS — J988 Other specified respiratory disorders: Secondary | ICD-10-CM | POA: Diagnosis not present

## 2022-06-22 DIAGNOSIS — S022XXA Fracture of nasal bones, initial encounter for closed fracture: Secondary | ICD-10-CM | POA: Diagnosis not present

## 2022-06-22 DIAGNOSIS — J3489 Other specified disorders of nose and nasal sinuses: Secondary | ICD-10-CM | POA: Diagnosis not present

## 2022-06-22 DIAGNOSIS — J342 Deviated nasal septum: Secondary | ICD-10-CM | POA: Diagnosis not present

## 2022-06-28 ENCOUNTER — Other Ambulatory Visit (HOSPITAL_COMMUNITY): Payer: Self-pay

## 2022-07-02 ENCOUNTER — Other Ambulatory Visit (HOSPITAL_COMMUNITY): Payer: Self-pay

## 2022-11-24 ENCOUNTER — Encounter (HOSPITAL_COMMUNITY): Payer: Self-pay

## 2023-01-21 ENCOUNTER — Other Ambulatory Visit (HOSPITAL_BASED_OUTPATIENT_CLINIC_OR_DEPARTMENT_OTHER): Payer: Self-pay

## 2023-02-15 DIAGNOSIS — Z23 Encounter for immunization: Secondary | ICD-10-CM | POA: Diagnosis not present

## 2023-02-18 DIAGNOSIS — H5213 Myopia, bilateral: Secondary | ICD-10-CM | POA: Diagnosis not present

## 2023-02-19 ENCOUNTER — Other Ambulatory Visit (HOSPITAL_COMMUNITY): Payer: Self-pay

## 2023-02-19 DIAGNOSIS — L738 Other specified follicular disorders: Secondary | ICD-10-CM | POA: Diagnosis not present

## 2023-02-19 DIAGNOSIS — L308 Other specified dermatitis: Secondary | ICD-10-CM | POA: Diagnosis not present

## 2023-02-19 DIAGNOSIS — D2271 Melanocytic nevi of right lower limb, including hip: Secondary | ICD-10-CM | POA: Diagnosis not present

## 2023-02-19 MED ORDER — DOXYCYCLINE HYCLATE 100 MG PO CAPS
100.0000 mg | ORAL_CAPSULE | Freq: Two times a day (BID) | ORAL | 1 refills | Status: DC
  Filled 2023-02-19: qty 60, 30d supply, fill #0

## 2023-07-14 ENCOUNTER — Encounter: Payer: Self-pay | Admitting: Physician Assistant

## 2023-07-14 ENCOUNTER — Telehealth: Admitting: Physician Assistant

## 2023-07-14 DIAGNOSIS — J0110 Acute frontal sinusitis, unspecified: Secondary | ICD-10-CM

## 2023-07-14 MED ORDER — AMOXICILLIN-POT CLAVULANATE 875-125 MG PO TABS: 1.0000 | ORAL_TABLET | Freq: Two times a day (BID) | ORAL | 0 refills | Status: AC

## 2023-07-14 NOTE — Progress Notes (Signed)
E-Visit for Sinus Problems  We are sorry that you are not feeling well.  Here is how we plan to help!  Based on what you have shared with me it looks like you have sinusitis.  Sinusitis is inflammation and infection in the sinus cavities of the head.  Based on your presentation I believe you most likely have Acute Bacterial Sinusitis.  This is an infection caused by bacteria and is treated with antibiotics. I have prescribed Augmentin 875mg/125mg one tablet twice daily with food, for 7 days. You may use an oral decongestant such as Mucinex D or if you have glaucoma or high blood pressure use plain Mucinex. Saline nasal spray help and can safely be used as often as needed for congestion.  If you develop worsening sinus pain, fever or notice severe headache and vision changes, or if symptoms are not better after completion of antibiotic, please schedule an appointment with a health care provider.    Sinus infections are not as easily transmitted as other respiratory infection, however we still recommend that you avoid close contact with loved ones, especially the very young and elderly.  Remember to wash your hands thoroughly throughout the day as this is the number one way to prevent the spread of infection!  Home Care: Only take medications as instructed by your medical team. Complete the entire course of an antibiotic. Do not take these medications with alcohol. A steam or ultrasonic humidifier can help congestion.  You can place a towel over your head and breathe in the steam from hot water coming from a faucet. Avoid close contacts especially the very young and the elderly. Cover your mouth when you cough or sneeze. Always remember to wash your hands.  Get Help Right Away If: You develop worsening fever or sinus pain. You develop a severe head ache or visual changes. Your symptoms persist after you have completed your treatment plan.  Make sure you Understand these instructions. Will watch  your condition. Will get help right away if you are not doing well or get worse.  Thank you for choosing an e-visit.  Your e-visit answers were reviewed by a board certified advanced clinical practitioner to complete your personal care plan. Depending upon the condition, your plan could have included both over the counter or prescription medications.  Please review your pharmacy choice. Make sure the pharmacy is open so you can pick up prescription now. If there is a problem, you may contact your provider through MyChart messaging and have the prescription routed to another pharmacy.  Your safety is important to us. If you have drug allergies check your prescription carefully.   For the next 24 hours you can use MyChart to ask questions about today's visit, request a non-urgent call back, or ask for a work or school excuse. You will get an email in the next two days asking about your experience. I hope that your e-visit has been valuable and will speed your recovery.  I have spent 5 minutes in review of e-visit questionnaire, review and updating patient chart, medical decision making and response to patient.   Korey Prashad S Mayers, PA-C     

## 2023-08-01 DIAGNOSIS — R300 Dysuria: Secondary | ICD-10-CM | POA: Diagnosis not present

## 2023-08-01 DIAGNOSIS — S9032XA Contusion of left foot, initial encounter: Secondary | ICD-10-CM | POA: Diagnosis not present

## 2023-08-01 DIAGNOSIS — X58XXXA Exposure to other specified factors, initial encounter: Secondary | ICD-10-CM | POA: Diagnosis not present

## 2023-08-01 DIAGNOSIS — M79672 Pain in left foot: Secondary | ICD-10-CM | POA: Diagnosis not present

## 2023-08-10 DIAGNOSIS — X58XXXA Exposure to other specified factors, initial encounter: Secondary | ICD-10-CM | POA: Diagnosis not present

## 2023-08-10 DIAGNOSIS — S93602A Unspecified sprain of left foot, initial encounter: Secondary | ICD-10-CM | POA: Diagnosis not present

## 2023-12-04 ENCOUNTER — Encounter (INDEPENDENT_AMBULATORY_CARE_PROVIDER_SITE_OTHER): Payer: Self-pay

## 2023-12-05 ENCOUNTER — Encounter (INDEPENDENT_AMBULATORY_CARE_PROVIDER_SITE_OTHER): Payer: Self-pay

## 2024-01-28 ENCOUNTER — Other Ambulatory Visit: Payer: Self-pay
